# Patient Record
Sex: Male | Born: 1990
Health system: Southern US, Community
[De-identification: ages and names within clinical notes are randomized; demographics above are authoritative.]

## PROBLEM LIST (undated history)

## (undated) DIAGNOSIS — Q231 Congenital insufficiency of aortic valve: Secondary | ICD-10-CM

## (undated) DIAGNOSIS — K219 Gastro-esophageal reflux disease without esophagitis: Secondary | ICD-10-CM

## (undated) DIAGNOSIS — Q2381 Bicuspid aortic valve: Secondary | ICD-10-CM

## (undated) HISTORY — DX: Gastro-esophageal reflux disease without esophagitis: K21.9

## (undated) HISTORY — DX: Bicuspid aortic valve: Q23.81

## (undated) HISTORY — PX: HYDROCELE EXCISION: SHX482

## (undated) HISTORY — DX: Congenital insufficiency of aortic valve: Q23.1

---

## 2015-12-11 ENCOUNTER — Encounter (HOSPITAL_COMMUNITY): Payer: Self-pay | Admitting: Family Medicine

## 2015-12-11 ENCOUNTER — Ambulatory Visit (HOSPITAL_COMMUNITY)
Admission: EM | Admit: 2015-12-11 | Discharge: 2015-12-11 | Disposition: A | Discharge status: Home / Self Care | Payer: 59 | Attending: Family Medicine | Admitting: Family Medicine

## 2015-12-11 DIAGNOSIS — R112 Nausea with vomiting, unspecified: Secondary | ICD-10-CM

## 2015-12-11 DIAGNOSIS — K529 Noninfective gastroenteritis and colitis, unspecified: Secondary | ICD-10-CM

## 2015-12-11 DIAGNOSIS — R197 Diarrhea, unspecified: Secondary | ICD-10-CM

## 2015-12-11 MED ORDER — ONDANSETRON HCL 4 MG PO TABS: 4.0000 mg | ORAL_TABLET | Freq: Four times a day (QID) | ORAL | 0 refills | Status: DC

## 2015-12-11 MED ORDER — ONDANSETRON HCL 4 MG/2ML IJ SOLN
4.0000 mg | Freq: Once | INTRAMUSCULAR | Status: AC
  Administered 2015-12-11: 4 mg via INTRAMUSCULAR

## 2015-12-11 MED ORDER — ONDANSETRON 4 MG PO TBDP
ORAL_TABLET | ORAL | Status: AC
  Filled 2015-12-11: qty 1

## 2015-12-11 NOTE — Discharge Instructions (Signed)
Take the Zofran to treat her nausea and vomiting. Obtain Pedialyte to drink for fluid replacement. Drink small amounts frequently. For diarrhea you may take Imodium AD once now and if you continued to have 4-5 additional stools in the next 6 hours may take 1 more. . You do not wish to stop the diarrhea completely with this medication as this is the only way the body has to shed the virus. This should last on average approximately 3 days. Continue with the clear liquids and do not try to eat meals in the next 24 hours.

## 2015-12-11 NOTE — ED Provider Notes (Signed)
CSN: [id number]     Arrival date & time 12/11/15  1359 History   First MD Initiated Contact with Patient 12/11/15 1437     Chief Complaint  Patient presents with  . Emesis  . Diarrhea   (Consider location/radiation/quality/duration/timing/severity/associated sxs/prior Treatment) 25 year old male states he just arrived home from [place] early this morning. He seemed developed of vomiting and diarrhea, around 7 AM. He has had midabdominal pain and he feels tired. States he has vomited around 5-6 times in the last several hours and diarrhea up to 20. Denies headache, chills, fever. He does have intermittent abdominal pain particularly associated with vomiting.      History reviewed. No pertinent past medical history. History reviewed. No pertinent surgical history. History reviewed. No pertinent family history. Social History  Substance Use Topics  . Smoking status: Never Smoker  . Smokeless tobacco: Never Used  . Alcohol use Not on file    Review of Systems  Constitutional: Positive for activity change, appetite change, chills and fatigue. Negative for fever.  HENT: Negative.   Respiratory: Negative.   Gastrointestinal: Positive for abdominal pain, diarrhea and vomiting. Negative for blood in stool.  Genitourinary: Negative.   Musculoskeletal: Negative.   Skin: Negative.   Neurological: Negative.   All other systems reviewed and are negative.   Allergies  Patient has no known allergies.  Home Medications   Prior to Admission medications   Medication Sig Start Date End Date Taking? Authorizing Provider  ondansetron (ZOFRAN) 4 MG tablet Take 1 tablet (4 mg total) by mouth every 6 (six) hours. Prn N and V. 12/11/15   Hayden Rasmussenavid Jerrard Bradburn, NP   Meds Ordered and Administered this Visit   Medications  ondansetron (ZOFRAN) injection 4 mg (not administered)    BP 129/83   Pulse 105   Temp 97 F (36.1 C)   Resp 18   SpO2 95%  No data found.   Physical Exam  Constitutional: He  is oriented to person, place, and time. He appears well-developed and well-nourished. No distress.  HENT:  Head: Normocephalic and atraumatic.  Nose: Nose normal.  Mouth/Throat: Oropharynx is clear and moist. No oropharyngeal exudate.  Eyes: EOM are normal.  Neck: Normal range of motion. Neck supple.  Cardiovascular: Normal rate, regular rhythm and normal heart sounds.   Pulmonary/Chest: Effort normal and breath sounds normal.  Abdominal: Soft. Bowel sounds are normal. No hernia.  Mild generalized tenderness. No distention. No rebound.  Musculoskeletal: He exhibits no edema.  Neurological: He is alert and oriented to person, place, and time.  Skin: Skin is warm and dry. No rash noted.  Psychiatric: He has a normal mood and affect.  Nursing note and vitals reviewed.   Urgent Care Course   Clinical Course     Procedures (including critical care time)  Labs Review Labs Reviewed - No data to display  Imaging Review No results found.   Visual Acuity Review  Right Eye Distance:   Left Eye Distance:   Bilateral Distance:    Right Eye Near:   Left Eye Near:    Bilateral Near:         MDM   1. Gastroenteritis   2. Nausea vomiting and diarrhea    Take the Zofran to treat her nausea and vomiting. Obtain Pedialyte to drink for fluid replacement. Drink small amounts frequently. For diarrhea you may take Imodium AD once now and if you continued to have 4-5 additional stools in the next 6 hours may take 1 more. .Marland Kitchen  You do not wish to stop the diarrhea completely with this medication as this is the only way the body has to shed the virus. This should last on average approximately 3 days. Continue with the clear liquids and do not try to eat meals in the next 24 hours.  Meds ordered this encounter  Medications  . ondansetron (ZOFRAN) injection 4 mg  . ondansetron (ZOFRAN) 4 MG tablet    Sig: Take 1 tablet (4 mg total) by mouth every 6 (six) hours. Prn N and V.    Dispense:  12  tablet    Refill:  0    Order Specific Question:   Supervising Provider    Answer:   Micheline ChapmanHONIG, ERIN J [[id number]]       Hayden Rasmussenavid Katelin Kutsch, NP 12/11/15 1544

## 2015-12-11 NOTE — ED Triage Notes (Signed)
Pt here for N,V,D that started this am. sts recent travel on a plane.

## 2016-10-09 ENCOUNTER — Encounter: Payer: Self-pay | Admitting: Family Medicine

## 2016-10-09 ENCOUNTER — Ambulatory Visit (INDEPENDENT_AMBULATORY_CARE_PROVIDER_SITE_OTHER): Payer: 59 | Admitting: Family Medicine

## 2016-10-09 VITALS — BP 126/86 | HR 86 | Temp 98.3°F | Ht 68.5 in | Wt 205.0 lb

## 2016-10-09 DIAGNOSIS — Q231 Congenital insufficiency of aortic valve: Secondary | ICD-10-CM | POA: Diagnosis not present

## 2016-10-09 DIAGNOSIS — Q825 Congenital non-neoplastic nevus: Secondary | ICD-10-CM | POA: Diagnosis not present

## 2016-10-09 DIAGNOSIS — Z7689 Persons encountering health services in other specified circumstances: Secondary | ICD-10-CM | POA: Diagnosis not present

## 2016-10-09 LAB — CBC WITH DIFFERENTIAL/PLATELET
BASOS PCT: 0.8 % (ref 0.0–3.0)
Basophils Absolute: 0 10*3/uL (ref 0.0–0.1)
Eosinophils Absolute: 0.1 10*3/uL (ref 0.0–0.7)
Eosinophils Relative: 1.1 % (ref 0.0–5.0)
HCT: 42.8 % (ref 39.0–52.0)
Hemoglobin: 14.5 g/dL (ref 13.0–17.0)
LYMPHS PCT: 38.3 % (ref 12.0–46.0)
Lymphs Abs: 1.8 10*3/uL (ref 0.7–4.0)
MCHC: 33.8 g/dL (ref 30.0–36.0)
MCV: 89.3 fl (ref 78.0–100.0)
MONOS PCT: 10.1 % (ref 3.0–12.0)
Monocytes Absolute: 0.5 10*3/uL (ref 0.1–1.0)
NEUTROS ABS: 2.4 10*3/uL (ref 1.4–7.7)
Neutrophils Relative %: 49.7 % (ref 43.0–77.0)
PLATELETS: 229 10*3/uL (ref 150.0–400.0)
RBC: 4.8 Mil/uL (ref 4.22–5.81)
RDW: 12.4 % (ref 11.5–15.5)
WBC: 4.8 10*3/uL (ref 4.0–10.5)

## 2016-10-09 LAB — BASIC METABOLIC PANEL
BUN: 14 mg/dL (ref 6–23)
CHLORIDE: 102 meq/L (ref 96–112)
CO2: 30 meq/L (ref 19–32)
CREATININE: 1.03 mg/dL (ref 0.40–1.50)
Calcium: 9.4 mg/dL (ref 8.4–10.5)
GFR: 92.68 mL/min (ref >60.00)
GLUCOSE: 86 mg/dL (ref 70–99)
Potassium: 4 mEq/L (ref 3.5–5.1)
SODIUM: 139 meq/L (ref 135–145)

## 2016-10-09 NOTE — Patient Instructions (Signed)
We have ordered labs at this visit. It can take up to 1-2 weeks for results and processing. If results require follow up or explanation, we will call you with instructions. Clinically stable results will be released to your MYCHART or sent to you via mail. If you have not heard from us or cannot find your results in MYCHART in 2 weeks please contact our office at [phone].    

## 2016-10-09 NOTE — Progress Notes (Signed)
    Patient presents to clinic today to establish care.  SUBJECTIVE: PMH: Pt is a 26 yo male w/ pmh sig for bicuspid aortic valve. Patient states it's been a while since he's been to the doctor.  Acute concern: -Birthmark on left hip/groin -Patient states birthmark has always been there but now looks slightly different. -At times painful, now with scale, possible color change -Patient interested in getting removed.  Allergies: -NKDA, NKDA  Past surgical history: -Removal of testicular fluid  Social history: Patient is originally from [place]. Patient moved to the area to attend school [employer]. Patient remained in the area secondary to girlfriend attending law school here. Patient denies tobacco and drug use. Patient endorses occasional alcohol use.  Family medical history: Mom-AAW Dad-AAW MGM- Heart problems PGF-CVA Brother-AAW Sister-adopted  Health Maintenance: Dental -- last visit years ago [facility] --no recent visit Immunizations -- declines flu   Past Medical History:  Diagnosis Date  . Bicuspid aortic valve     History reviewed. No pertinent surgical history.  No current outpatient prescriptions on file prior to visit.   No current facility-administered medications on file prior to visit.     No Known Allergies  History reviewed. No pertinent family history.   ROS General: Denies fever, chills, night sweats, changes in weight, changes in appetite HEENT: Denies headaches, ear pain, changes in vision, rhinorrhea, sore throat CV: Denies CP, palpitations, SOB, orthopnea Pulm: Denies SOB, cough, wheezing GI: Denies abdominal pain, nausea, vomiting, diarrhea, constipation GU: Denies dysuria, hematuria, frequency, vaginal discharge Msk: Denies muscle cramps, joint pains Neuro: Denies weakness, numbness, tingling Skin: Denies rashes, bruising.  + Birthmark  Psych: Denies depression, anxiety, hallucinations  BP 126/86 (BP Location: Left  Arm, Cuff Size: Normal)   Pulse 86   Temp 98.3 F (36.8 C) (Oral)   Ht 5' 8.5" (1.74 m)   Wt 205 lb (93 kg)   BMI 30.72 kg/m   Physical Exam Gen. Pleasant, well developed, well-nourished, in NAD HEENT - [employer]/AT, PERRL, no scleral icterus, no nasal drainage, pharynx without erythema or exudate. Neck: No JVD, no thyromegaly Lungs: no accessory muscle use, CTAB, no wheezes, rales or rhonchi Cardiovascular: RRR, 2/6 murmur best heard at right upper sternal border, no peripheral edema Abdomen: BS present, soft, nontender,nondistended, no hepatosplenomegaly Musculoskeletal: No deformities, moves all four extremities, no cyanosis or clubbing, normal tone Neuro:  A&Ox3, CN II-XII intact, normal gait Skin:  Warm, dry, intact, with raised, scaly in appearance hyperpigmented lesion. Psych: normal affect, mood appropriate  No results found for this or any previous visit (from the past 2160 hour(s)).  Assessment/Plan: Birth mark  -Per patient birthmark. Similar appearance to seborrheic keratosis -Patient interested in removal - Plan: Ambulatory referral to Dermatology  Encounter to establish care -Obtain baseline labs. -Records release  Bicuspid aortic valve  -Asymptomatic -2/6 murmur heard on exam -Plan: CBC with Differential/Platelet, Basic metabolic panel, Ambulatory referral to Cardiology for ECHO

## 2016-10-10 ENCOUNTER — Encounter: Payer: Self-pay | Admitting: Family Medicine

## 2016-10-22 NOTE — Progress Notes (Signed)
Cardiology Office Note   Date:  10/24/2016   ID:  Jimmy Russo, DOB Apr 07, 1990, MRN [id number]  PCP:  Deeann Saint, MD  Cardiologist:   Rollene Rotunda, MD  Referring:  Deeann Saint, MD  Chief Complaint  Patient presents with  . Bicuspid Aortic Valve      History of Present Illness: Jimmy Russo is a 26 y.o. male who is referred by Deeann Saint, MD for evaluation of bicuspid aortic valve.  The patient says he is known about this since he was born. He was in [place]. He hasn't seen a cardiologist in several years since he was in college. He said they've been following him but told him it would not be a problem. He doesn't recall any further imaging although when prompted there might have been a CT of his aorta at some point. He did have one sibling died early as an infant but he doesn't know any details. He has 1 biological brother who has no congenital heart disease. His parents had no congenital heart disease. He has no children. He's been relatively active. He played on a paint ball club in college.  He was able to do physical activity without limitations. He doesn't exercise routinely. With his activities of daily living he denies any cardiovascular symptoms. The patient denies any new symptoms such as chest discomfort, neck or arm discomfort. There has been no new shortness of breath, PND or orthopnea. There have been no reported palpitations, presyncope or syncope.      Past Medical History:  Diagnosis Date  . Bicuspid aortic valve     Past Surgical History:  Procedure Laterality Date  . HYDROCELE EXCISION       No current outpatient prescriptions on file.   No current facility-administered medications for this visit.     Allergies:   Patient has no known allergies.    Social History:  The patient  reports that he has quit smoking. He has quit using smokeless tobacco. His smokeless tobacco use included Chew. He reports that he drinks  alcohol. He reports that he does not use drugs.   Family History:  The patient's family history includes Hyperlipidemia in his father.    ROS:  Please see the history of present illness.   Otherwise, review of systems are positive for none.   All other systems are reviewed and negative.    PHYSICAL EXAM: VS:  BP 124/90 (BP Location: Left Arm, Patient Position: Sitting, Cuff Size: Normal)   Pulse 78   Ht  (1.753 m)   Wt 207 lb 12.8 oz (94.3 kg)   BMI 30.69 kg/m  , BMI Body mass index is 30.69 kg/m. GENERAL:  Well appearing HEENT:  Pupils equal round and reactive, fundi not visualized, oral mucosa unremarkable NECK:  No jugular venous distention, waveform within normal limits, carotid upstroke brisk and symmetric, no bruits, no thyromegaly LYMPHATICS:  No cervical, inguinal adenopathy LUNGS:  Clear to auscultation bilaterally BACK:  No CVA tenderness CHEST:  Unremarkable HEART:  PMI not displaced or sustained,S1 and S2 within normal limits, no S3, no S4, positive ejection click, no rubs, very soft apical early peaking systolic murmur, no diastolic murmurs ABD:  Flat, positive bowel sounds normal in frequency in pitch, no bruits, no rebound, no guarding, no midline pulsatile mass, no hepatomegaly, no splenomegaly EXT:  2 plus pulses throughout, no edema, no cyanosis no clubbing SKIN:  No rashes no nodules NEURO:  Cranial nerves II  through XII grossly intact, motor grossly intact throughout [facility]:  Cognitively intact, oriented to person place and time    EKG:  EKG is ordered today. The ekg ordered today demonstrates sinus rhythm, rate 78, axis within normal limits, intervals within normal limits, no acute changes   Recent Labs: 10/09/2016: BUN 14; Creatinine, Ser 1.03; Hemoglobin 14.5; Platelets 229.0; Potassium 4.0; Sodium 139    Lipid Panel No results found for: CHOL, TRIG, HDL, CHOLHDL, VLDL, LDLCALC, LDLDIRECT    Wt Readings from Last 3 Encounters:  10/23/16 207 lb  12.8 oz (94.3 kg)  10/09/16 205 lb (93 kg)      Other studies Reviewed: Additional studies/ records that were reviewed today include: .None. Review of the above records demonstrates:  Please see elsewhere in the note.     ASSESSMENT AND PLAN:  BICUSPID AORTIC VALVE:  I suspect is from his exam. However, I don't suspect any significant stenosis. His blood pressures were equal in both arms. He does need imaging of his entire aorta if this has not been done before and I would suggest this but he is going to check with his parents to see what was done when he was younger. I am going to check a baseline echocardiogram. However, it's unlikely that he would need further treatment or testing at this point. Of note he does not meet current guidelines for SBE prophylaxis although I have suggested good dental hygiene. We talked at length the pathophysiology and natural history of bicuspid aortic valve.   Current medicines are reviewed at length with the patient today.  The patient does not have concerns regarding medicines.  The following changes have been made:  no change  Labs/ tests ordered today include:   Orders Placed This Encounter  Procedures  . EKG 12-Lead  . ECHOCARDIOGRAM COMPLETE     Disposition:   FU with me in one year.     Signed, Rollene Rotunda, MD  10/24/2016 7:55 AM    [facility]

## 2016-10-23 ENCOUNTER — Ambulatory Visit (INDEPENDENT_AMBULATORY_CARE_PROVIDER_SITE_OTHER): Payer: 59 | Admitting: Cardiology

## 2016-10-23 ENCOUNTER — Encounter: Payer: Self-pay | Admitting: Cardiology

## 2016-10-23 VITALS — BP 124/90 | HR 78 | Ht 69.0 in | Wt 207.8 lb

## 2016-10-23 DIAGNOSIS — Q231 Congenital insufficiency of aortic valve: Secondary | ICD-10-CM | POA: Diagnosis not present

## 2016-10-23 NOTE — Patient Instructions (Signed)
Medication Instructions:  None   If you need a refill on your cardiac medications before your next appointment, please call your pharmacy.  Labwork: None Ordered    Testing/Procedures: Your physician has requested that you have an echocardiogram. Echocardiography is a painless test that uses sound waves to create images of your heart. It provides your doctor with information about the size and shape of your heart and how well your heart's chambers and valves are working. This procedure takes approximately one hour. There are no restrictions for this procedure.   Follow-Up: Your physician wants you to follow-up in: 18 Months You should receive a reminder letter in the mail two months in advance. If you do not receive a letter, please call our office [phone].   Thank you for choosing [facility] at [facility]!!

## 2016-10-24 ENCOUNTER — Encounter: Payer: Self-pay | Admitting: Cardiology

## 2016-10-31 ENCOUNTER — Ambulatory Visit (HOSPITAL_COMMUNITY): Payer: 59 | Attending: Internal Medicine

## 2016-10-31 ENCOUNTER — Other Ambulatory Visit: Payer: Self-pay

## 2016-10-31 DIAGNOSIS — Q231 Congenital insufficiency of aortic valve: Secondary | ICD-10-CM | POA: Insufficient documentation

## 2016-10-31 DIAGNOSIS — Z87891 Personal history of nicotine dependence: Secondary | ICD-10-CM | POA: Diagnosis not present

## 2016-10-31 DIAGNOSIS — Z8249 Family history of ischemic heart disease and other diseases of the circulatory system: Secondary | ICD-10-CM | POA: Diagnosis not present

## 2016-11-04 ENCOUNTER — Telehealth: Payer: Self-pay | Admitting: Cardiology

## 2016-11-04 NOTE — Telephone Encounter (Signed)
Returning your call from yesterday,concerning his Echo results.

## 2016-11-05 NOTE — Telephone Encounter (Signed)
Reviewed results with patient. He is unsure if he has had MRI or CT scan but has had previus Echo. He will try to find out what he has had and have sent to office along with previous Echo.   Notes recorded by Barrie Dunkerhomas, Nyasha N on 11/03/2016 at 2:55 PM EDT Leave message for pt to call back  ------  Notes recorded by Rollene RotundaHochrein, James, MD on 11/02/2016 at 9:10 AM EDT Bicuspid valve. Mild AS.  No AI.  No change in therapy. Please call him and ask him if he found out anything about his cardiology studies that he has had. In particular, I am looking to see if he has ever had a CT of his aorta or MRI of his aorta. Call Mr. Will Bonnetezzullo with the results and send results to Deeann SaintBanks, Shannon R, MD

## 2016-11-05 NOTE — Telephone Encounter (Signed)
Follow up ° ° ° ° °Patient calling for results. Please call °

## 2016-11-14 ENCOUNTER — Ambulatory Visit: Payer: 59 | Admitting: Cardiology

## 2016-12-04 ENCOUNTER — Telehealth: Payer: Self-pay | Admitting: Cardiology

## 2016-12-04 NOTE — Telephone Encounter (Signed)
I have called and left a couple of messages to discuss further testing with this patient.  I will continue to try to contact him.

## 2017-01-22 ENCOUNTER — Telehealth: Payer: Self-pay | Admitting: *Deleted

## 2017-01-22 NOTE — Telephone Encounter (Signed)
-----   Message from James Hochrein, MD sent at 01/14/2017  5:48 PM EST ----- Can you see if there is anyway to get in touch with this patient.  I have left messages but I am not sure if we have the correct number.  

## 2017-01-22 NOTE — Telephone Encounter (Signed)
Called and left several messages on pt voice mail to give out office a call

## 2017-01-22 NOTE — Telephone Encounter (Signed)
-----   Message from Rollene RotundaJames Hochrein, MD sent at 01/14/2017  5:48 PM EST ----- Can you see if there is anyway to get in touch with this patient.  I have left messages but I am not sure if we have the correct number.

## 2017-01-22 NOTE — Telephone Encounter (Signed)
Letter to contact office mail to pt.

## 2017-01-23 ENCOUNTER — Telehealth: Payer: Self-pay | Admitting: Cardiology

## 2017-01-23 NOTE — Telephone Encounter (Signed)
F/U call: ° °Patient returning call  °

## 2017-01-23 NOTE — Telephone Encounter (Signed)
Returned the call to the patient. He stated that he was returning a phone call. According to previous notes, it looks as if his provider has been trying to get in touch with him. His number is correct in epic and he has stated that this is a good number for him to be reached at. Message will be routed to the provider for his knowledge.

## 2017-01-29 NOTE — Telephone Encounter (Signed)
Follow up   Patient returning phone call 

## 2017-03-24 ENCOUNTER — Encounter: Payer: Self-pay | Admitting: Cardiology

## 2017-03-24 DIAGNOSIS — Q2381 Bicuspid aortic valve: Secondary | ICD-10-CM

## 2017-03-24 DIAGNOSIS — I350 Nonrheumatic aortic (valve) stenosis: Secondary | ICD-10-CM

## 2017-03-24 DIAGNOSIS — Q231 Congenital insufficiency of aortic valve: Secondary | ICD-10-CM

## 2017-03-24 DIAGNOSIS — Z8249 Family history of ischemic heart disease and other diseases of the circulatory system: Secondary | ICD-10-CM

## 2017-03-27 ENCOUNTER — Encounter: Payer: Self-pay | Admitting: Family Medicine

## 2017-03-27 ENCOUNTER — Ambulatory Visit: Payer: 59 | Admitting: Family Medicine

## 2017-03-27 VITALS — BP 132/88 | HR 86 | Temp 98.6°F | Wt 205.0 lb

## 2017-03-27 DIAGNOSIS — K219 Gastro-esophageal reflux disease without esophagitis: Secondary | ICD-10-CM | POA: Diagnosis not present

## 2017-03-27 DIAGNOSIS — R1011 Right upper quadrant pain: Secondary | ICD-10-CM | POA: Diagnosis not present

## 2017-03-27 LAB — CBC
HEMATOCRIT: 42.8 % (ref 39.0–52.0)
Hemoglobin: 14.6 g/dL (ref 13.0–17.0)
MCHC: 34.1 g/dL (ref 30.0–36.0)
MCV: 88.6 fl (ref 78.0–100.0)
PLATELETS: 243 10*3/uL (ref 150.0–400.0)
RBC: 4.83 Mil/uL (ref 4.22–5.81)
RDW: 12.6 % (ref 11.5–15.5)
WBC: 5.7 10*3/uL (ref 4.0–10.5)

## 2017-03-27 LAB — COMPREHENSIVE METABOLIC PANEL
ALT: 13 U/L (ref 0–53)
AST: 13 U/L (ref 0–37)
Albumin: 4.6 g/dL (ref 3.5–5.2)
Alkaline Phosphatase: 56 U/L (ref 39–117)
BUN: 14 mg/dL (ref 6–23)
CALCIUM: 9.6 mg/dL (ref 8.4–10.5)
CHLORIDE: 101 meq/L (ref 96–112)
CO2: 30 meq/L (ref 19–32)
CREATININE: 0.87 mg/dL (ref 0.40–1.50)
GFR: 112.22 mL/min (ref >60.00)
Glucose, Bld: 88 mg/dL (ref 70–99)
POTASSIUM: 4.1 meq/L (ref 3.5–5.1)
SODIUM: 138 meq/L (ref 135–145)
Total Bilirubin: 0.7 mg/dL (ref 0.2–1.2)
Total Protein: 7.4 g/dL (ref 6.0–8.3)

## 2017-03-27 LAB — LIPASE: LIPASE: 28 U/L (ref 11.0–59.0)

## 2017-03-27 MED ORDER — OMEPRAZOLE 20 MG PO CPDR: 20.0000 mg | DELAYED_RELEASE_CAPSULE | Freq: Every day | ORAL | 3 refills | Status: DC

## 2017-03-27 NOTE — Progress Notes (Signed)
Subjective:    Patient ID: Jimmy Russo, male    DOB: 10/09/1990, 27 y.o.   MRN: [id number]  No chief complaint on file.   HPI Patient was seen today for ongoing concern.  Patient endorses right upper quadrant pain noticed after eating.  This is been happening off and on times 8 months.  Patient notes pain/symptoms are worse with eating fast food and drinking beer.  He is also endorses symptoms at night including heartburn, increased flatus, some nausea, acid taste in mouth.  Patient may take Tums occasionally.  Patient endorses daily BMs but notes they are small pimple-like in size.  Patient states in the [facility], x-rays were done and he was told there was some gas in his colon.  Past Medical History:  Diagnosis Date  . Bicuspid aortic valve     No Known Allergies  ROS General: Denies fever, chills, night sweats, changes in weight, changes in appetite HEENT: Denies headaches, ear pain, changes in vision, rhinorrhea, sore throat  + acid taste in mouth CV: Denies CP, palpitat, flatus ions, SOB, orthopnea Pulm: Denies SOB, cough, wheezing GI: Denies vomiting, diarrhea, constipation  + RUQ abdominal pain, nausea GU: Denies dysuria, hematuria, frequency, vaginal discharge Msk: Denies muscle cramps, joint pains Neuro: Denies weakness, numbness, tingling Skin: Denies rashes, bruising Psych: Denies depression, anxiety, hallucinations     Objective:    Blood pressure 132/88, pulse 86, temperature 98.6 F (37 C), temperature source Oral, weight 205 lb (93 kg), SpO2 99 %.   Gen. Pleasant, well-nourished, in no distress, normal affect   HEENT: [place]/AT, face symmetric, no scleral icterus, PERRLA, nares patent without drainage, pharynx without erythema or exudate. Neck: No JVD, no thyromegaly, no carotid bruits Lungs: no accessory muscle use, CTAB, no wheezes or rales Cardiovascular: RRR, 2/6 murmur best heard l sternal border, no peripheral  edema Abdomen: BS present, soft, mild TTP RUQ/ND, no hepatosplenomegaly. Neuro:  A&Ox3, CN II-XII intact, normal gait   Wt Readings from Last 3 Encounters:  03/27/17 205 lb (93 kg)  10/23/16 207 lb 12.8 oz (94.3 kg)  10/09/16 205 lb (93 kg)    Lab Results  Component Value Date   WBC 4.8 10/09/2016   HGB 14.5 10/09/2016   HCT 42.8 10/09/2016   PLT 229.0 10/09/2016   GLUCOSE 86 10/09/2016   NA 139 10/09/2016   K 4.0 10/09/2016   CL 102 10/09/2016   CREATININE 1.03 10/09/2016   BUN 14 10/09/2016   CO2 30 10/09/2016    Assessment/Plan:  RUQ pain  -Discussed signs and symptoms of biliary colic. -We will obtain labs and right upper quadrant ultrasound. -Given handout - Plan: CBC (no diff), Comprehensive metabolic panel, Lipase, US Abdomen Limited RUQ -Given RTC or ED precautions  Gastroesophageal reflux disease, esophagitis presence not specified  - Plan: omeprazole (PRILOSEC) 20 MG capsule -Given a list of foods that are known to cause current symptoms.  Follow-up PRN the next few weeks  Abbe AmsterdamShannon Latera Mclin, MD

## 2017-03-27 NOTE — Patient Instructions (Addendum)
Biliary Colic, Adult Biliary colic is severe pain caused by a problem with a small organ in the upper right part of your belly (gallbladder). The gallbladder stores a digestive fluid produced in the liver (bile) that helps the body break down fat. Bile and other digestive enzymes are carried from the liver to the small intestine though tube-like structures (bile ducts). The gallbladder and the bile ducts form the biliary tract. Sometimes hard deposits of digestive fluids form in the gallbladder (gallstones) and block the flow of bile from the gallbladder, causing biliary colic. This condition is also called a gallbladder attack. Gallstones can be as small as a grain of sand or as big as a golf ball. There could be just one gallstone in the gallbladder, or there could be many. What are the causes? Biliary colic is usually caused by gallstones. Less often, a tumor could block the flow of bile from the gallbladder and trigger biliary colic. What increases the risk? This condition is more likely to develop in:  Women.  People of Hispanic descent.  People with a family history of gallstones.  People who are obese.  People who suddenly or quickly lose weight.  People who eat a high-calorie, low-fiber diet that is rich in refined carbs (carbohydrates), such as white bread and white rice.  People who have an intestinal disease that affects nutrient absorption, such as Crohn disease.  People who have a metabolic condition, such as metabolic syndrome or diabetes.  What are the signs or symptoms? Severe pain in the upper right side of the belly is the main symptom of biliary colic. You may feel this pain below the chest but above the hip. This pain often occurs at night or after eating a very fatty meal. This pain may get worse for up to an hour and last as long as 12 hours. In most cases, the pain fades (subsides) within a couple hours. Other symptoms of this condition include:  Nausea and  vomiting.  Pain under the right shoulder.  How is this diagnosed? This condition is diagnosed based on your medical history, your symptoms, and a physical exam. You may have tests, including:  Blood tests to rule out infection or inflammation of the bile ducts, gallbladder, pancreas, or liver.  Imaging studies such as: ? Ultrasound. ? CT scan. ? MRI.  In some cases, you may need to have an imaging study done using a small amount of radioactive material (nuclear medicine) to confirm the diagnosis. How is this treated? Treatment for this condition may include medicine to relieve your pain or nausea. If you have gallstones that are causing biliary colic, you may need surgery to remove the gallbladder (cholecystectomy). Gallstones can also be dissolved gradually with medicine. It may take months or years before the gallstones are completely gone. Follow these instructions at home:  Take over-the-counter and prescription medicines only as told by your health care provider.  Drink enough fluid to keep your urine clear or pale yellow.  Follow instructions from your health care provider about eating or drinking restrictions. These may include avoiding: ? Fatty, greasy, and fried foods. ? Any foods that make the pain worse. ? Overeating. ? Having a large meal after not eating for a while.  Keep all follow-up visits as told by your health care provider. This is important. How is this prevented? Steps to prevent this condition include:  Maintaining a healthy body weight.  Getting regular exercise.  Eating a healthy, high-fiber, low-fat diet.  Limiting  how much sugar and refined carbs you eat, such as sweets, white flour, and white rice.  Contact a health care provider if:  Your pain lasts more than 5 hours.  You vomit.  You have a fever and chills.  Your pain gets worse. Get help right away if:  Your skin or the whites of your eyes look yellow (jaundice).  Your have  tea-colored urine and light-colored stools.  You are dizzy or you faint. This information is not intended to replace advice given to you by your health care provider. Make sure you discuss any questions you have with your health care provider. Document Released: 06/09/2005 Document Revised: 09/04/2015 Document Reviewed: 07/23/2015 Elsevier Interactive Patient Education  2018 [employer]  Food Choices for Gastroesophageal Reflux Disease, Adult When you have gastroesophageal reflux disease (GERD), the foods you eat and your eating habits are very important. Choosing the right foods can help ease the discomfort of GERD. Consider working with a diet and nutrition specialist (dietitian) to help you make healthy food choices. What general guidelines should I follow? Eating plan  Choose healthy foods low in fat, such as fruits, vegetables, whole grains, low-fat dairy products, and lean meat, fish, and poultry.  Eat frequent, small meals instead of three large meals each day. Eat your meals slowly, in a relaxed setting. Avoid bending over or lying down until 2-3 hours after eating.  Limit high-fat foods such as fatty meats or fried foods.  Limit your intake of oils, butter, and shortening to less than 8 teaspoons each day.  Avoid the following: ? Foods that cause symptoms. These may be different for different people. Keep a food diary to keep track of foods that cause symptoms. ? Alcohol. ? Drinking large amounts of liquid with meals. ? Eating meals during the 2-3 hours before bed.  Cook foods using methods other than frying. This may include baking, grilling, or broiling. Lifestyle   Maintain a healthy weight. Ask your health care provider what weight is healthy for you. If you need to lose weight, work with your health care provider to do so safely.  Exercise for at least 30 minutes on 5 or more days each week, or as told by your health care provider.  Avoid wearing clothes that fit  tightly around your waist and chest.  Do not use any products that contain nicotine or tobacco, such as cigarettes and e-cigarettes. If you need help quitting, ask your health care provider.  Sleep with the head of your bed raised. Use a wedge under the mattress or blocks under the bed frame to raise the head of the bed. What foods are not recommended? The items listed may not be a complete list. Talk with your dietitian about what dietary choices are best for you. Grains Pastries or quick breads with added fat. [place] toast. Vegetables Deep fried vegetables. [place] fries. Any vegetables prepared with added fat. Any vegetables that cause symptoms. For some people this may include tomatoes and tomato products, chili peppers, onions and garlic, and horseradish. Fruits Any fruits prepared with added fat. Any fruits that cause symptoms. For some people this may include citrus fruits, such as oranges, grapefruit, pineapple, and lemons. Meats and other protein foods High-fat meats, such as fatty beef or pork, hot dogs, ribs, ham, sausage, salami and bacon. Fried meat or protein, including fried fish and fried chicken. Nuts and nut butters. Dairy Whole milk and chocolate milk. Sour cream. Cream. Ice cream. Cream cheese. Milk shakes. Beverages Coffee  and tea, with or without caffeine. Carbonated beverages. Sodas. Energy drinks. Fruit juice made with acidic fruits (such as orange or grapefruit). Tomato juice. Alcoholic drinks. Fats and oils Butter. Margarine. Shortening. Ghee. Sweets and desserts Chocolate and cocoa. Donuts. Seasoning and other foods Pepper. Peppermint and spearmint. Any condiments, herbs, or seasonings that cause symptoms. For some people, this may include curry, hot sauce, or vinegar-based salad dressings. Summary  When you have gastroesophageal reflux disease (GERD), food and lifestyle choices are very important to help ease the discomfort of GERD.  Eat frequent, small meals  instead of three large meals each day. Eat your meals slowly, in a relaxed setting. Avoid bending over or lying down until 2-3 hours after eating.  Limit high-fat foods such as fatty meat or fried foods. This information is not intended to replace advice given to you by your health care provider. Make sure you discuss any questions you have with your health care provider. Document Released: 01/06/2005 Document Revised: 01/08/2016 Document Reviewed: 01/08/2016 Elsevier Interactive Patient Education  2018 [employer]  Gastroesophageal Reflux Disease, Adult Normally, food travels down the esophagus and stays in the stomach to be digested. If a person has gastroesophageal reflux disease (GERD), food and stomach acid move back up into the esophagus. When this happens, the esophagus becomes sore and swollen (inflamed). Over time, GERD can make small holes (ulcers) in the lining of the esophagus. Follow these instructions at home: Diet  Follow a diet as told by your doctor. You may need to avoid foods and drinks such as: ? Coffee and tea (with or without caffeine). ? Drinks that contain alcohol. ? Energy drinks and sports drinks. ? Carbonated drinks or sodas. ? Chocolate and cocoa. ? Peppermint and mint flavorings. ? Garlic and onions. ? Horseradish. ? Spicy and acidic foods, such as peppers, chili powder, curry powder, vinegar, hot sauces, and BBQ sauce. ? Citrus fruit juices and citrus fruits, such as oranges, lemons, and limes. ? Tomato-based foods, such as red sauce, chili, salsa, and pizza with red sauce. ? Fried and fatty foods, such as donuts, french fries, potato chips, and high-fat dressings. ? High-fat meats, such as hot dogs, rib eye steak, sausage, ham, and bacon. ? High-fat dairy items, such as whole milk, butter, and cream cheese.  Eat small meals often. Avoid eating large meals.  Avoid drinking large amounts of liquid with your meals.  Avoid eating meals during the 2-3  hours before bedtime.  Avoid lying down right after you eat.  Do not exercise right after you eat. General instructions  Pay attention to any changes in your symptoms.  Take over-the-counter and prescription medicines only as told by your doctor. Do not take aspirin, ibuprofen, or other NSAIDs unless your doctor says it is okay.  Do not use any tobacco products, including cigarettes, chewing tobacco, and e-cigarettes. If you need help quitting, ask your doctor.  Wear loose clothes. Do not wear anything tight around your waist.  Raise (elevate) the head of your bed about 6 inches (15 cm).  Try to lower your stress. If you need help doing this, ask your doctor.  If you are overweight, lose an amount of weight that is healthy for you. Ask your doctor about a safe weight loss goal.  Keep all follow-up visits as told by your doctor. This is important. Contact a doctor if:  You have new symptoms.  You lose weight and you do not know why it is happening.  You have trouble  swallowing, or it hurts to swallow.  You have wheezing or a cough that keeps happening.  Your symptoms do not get better with treatment.  You have a hoarse voice. Get help right away if:  You have pain in your arms, neck, jaw, teeth, or back.  You feel sweaty, dizzy, or light-headed.  You have chest pain or shortness of breath.  You throw up (vomit) and your throw up looks like blood or coffee grounds.  You pass out (faint).  Your poop (stool) is bloody or black.  You cannot swallow, drink, or eat. This information is not intended to replace advice given to you by your health care provider. Make sure you discuss any questions you have with your health care provider. Document Released: 06/25/2007 Document Revised: 06/14/2015 Document Reviewed: 05/03/2014 Elsevier Interactive Patient Education  [employer].

## 2017-04-01 ENCOUNTER — Telehealth: Payer: Self-pay | Admitting: *Deleted

## 2017-04-01 NOTE — Telephone Encounter (Signed)
-----   Message from Rollene RotundaJames Hochrein, MD sent at 03/31/2017  6:47 PM EDT ----- Both ----- Message ----- From: Barrie Dunkerhomas, Nyasha N Sent: 03/31/2017   4:59 PM To: Rollene RotundaJames Hochrein, MD  Just to clarify do you want the coronary CT the one Dr Delton SeeNelson and Dr Eden EmmsNishan read or do you want the CT of his Aorta.Marland Kitchen.  ----- Message ----- From: Rollene RotundaHochrein, James, MD Sent: 03/25/2017  10:15 PM To: Barrie DunkerNyasha N Thomas  This patient needs a CT of the coronaries and aorta.   Not sure how this is ordered.  Please let me know when it is scheduled and call the patient.  Thanks.

## 2017-04-01 NOTE — Telephone Encounter (Signed)
CTA ordered and send to scheduler to be schedule 

## 2017-04-01 NOTE — Telephone Encounter (Signed)
-----   Message from James Hochrein, MD sent at 03/31/2017  6:47 PM EDT ----- Both ----- Message ----- From: Bartt Gonzaga N Sent: 03/31/2017   4:59 PM To: James Hochrein, MD  Just to clarify do you want the coronary CT the one Dr Nelson and Dr Nishan read or do you want the CT of his Aorta..  ----- Message ----- From: Hochrein, James, MD Sent: 03/25/2017  10:15 PM To: Kymere Fullington N Presli Fanguy  This patient needs a CT of the coronaries and aorta.   Not sure how this is ordered.  Please let me know when it is scheduled and call the patient.  Thanks.    

## 2017-04-02 ENCOUNTER — Telehealth: Payer: Self-pay | Admitting: *Deleted

## 2017-04-02 DIAGNOSIS — I350 Nonrheumatic aortic (valve) stenosis: Secondary | ICD-10-CM

## 2017-04-02 DIAGNOSIS — I359 Nonrheumatic aortic valve disorder, unspecified: Secondary | ICD-10-CM

## 2017-04-02 NOTE — Telephone Encounter (Signed)
-----   Message from James Hochrein, MD sent at 03/31/2017  6:47 PM EDT ----- Both ----- Message ----- From: Decorey Wahlert N Sent: 03/31/2017   4:59 PM To: James Hochrein, MD  Just to clarify do you want the coronary CT the one Dr Nelson and Dr Nishan read or do you want the CT of his Aorta..  ----- Message ----- From: Hochrein, James, MD Sent: 03/25/2017  10:15 PM To: Iretta Mangrum N Biddie Sebek  This patient needs a CT of the coronaries and aorta.   Not sure how this is ordered.  Please let me know when it is scheduled and call the patient.  Thanks.    

## 2017-04-03 ENCOUNTER — Ambulatory Visit
Admission: RE | Admit: 2017-04-03 | Discharge: 2017-04-03 | Disposition: A | Discharge status: Home / Self Care | Payer: 59 | Source: Ambulatory Visit | Attending: Family Medicine | Admitting: Family Medicine

## 2017-04-03 DIAGNOSIS — R1011 Right upper quadrant pain: Secondary | ICD-10-CM

## 2017-04-16 ENCOUNTER — Ambulatory Visit (HOSPITAL_COMMUNITY)
Admission: RE | Admit: 2017-04-16 | Discharge: 2017-04-16 | Disposition: A | Discharge status: Home / Self Care | Payer: 59 | Source: Ambulatory Visit | Attending: Cardiology | Admitting: Cardiology

## 2017-04-16 ENCOUNTER — Ambulatory Visit (HOSPITAL_COMMUNITY): Admission: RE | Admit: 2017-04-16 | Payer: 59 | Source: Ambulatory Visit

## 2017-04-16 ENCOUNTER — Encounter (HOSPITAL_COMMUNITY): Payer: Self-pay

## 2017-04-16 ENCOUNTER — Ambulatory Visit (HOSPITAL_COMMUNITY): Payer: 59

## 2017-04-16 DIAGNOSIS — Z8249 Family history of ischemic heart disease and other diseases of the circulatory system: Secondary | ICD-10-CM

## 2017-04-16 DIAGNOSIS — I350 Nonrheumatic aortic (valve) stenosis: Secondary | ICD-10-CM

## 2017-04-16 DIAGNOSIS — I359 Nonrheumatic aortic valve disorder, unspecified: Secondary | ICD-10-CM

## 2017-04-16 DIAGNOSIS — Q231 Congenital insufficiency of aortic valve: Secondary | ICD-10-CM | POA: Insufficient documentation

## 2017-04-16 DIAGNOSIS — R079 Chest pain, unspecified: Secondary | ICD-10-CM | POA: Diagnosis not present

## 2017-04-16 MED ORDER — IOPAMIDOL (ISOVUE-370) INJECTION 76%
INTRAVENOUS | Status: AC
  Administered 2017-04-16: 100 mL
  Filled 2017-04-16: qty 100

## 2017-04-16 MED ORDER — METOPROLOL TARTRATE 5 MG/5ML IV SOLN
INTRAVENOUS | Status: AC
  Administered 2017-04-16: 5 mg via INTRAVENOUS
  Filled 2017-04-16: qty 20

## 2017-04-16 MED ORDER — METOPROLOL TARTRATE 5 MG/5ML IV SOLN
5.0000 mg | INTRAVENOUS | Status: DC | PRN
Start: 2017-04-16 — End: 2017-04-17
  Administered 2017-04-16 (×3): 5 mg via INTRAVENOUS

## 2017-04-16 MED ORDER — NITROGLYCERIN 0.4 MG SL SUBL
SUBLINGUAL_TABLET | SUBLINGUAL | Status: AC
  Administered 2017-04-16: 0.8 mg
  Filled 2017-04-16: qty 2

## 2017-05-20 ENCOUNTER — Encounter: Payer: Self-pay | Admitting: Family Medicine

## 2017-05-20 ENCOUNTER — Ambulatory Visit: Payer: 59 | Admitting: Family Medicine

## 2017-05-20 VITALS — BP 130/80 | HR 99 | Temp 98.2°F | Ht 68.0 in | Wt 206.0 lb

## 2017-05-20 DIAGNOSIS — K219 Gastro-esophageal reflux disease without esophagitis: Secondary | ICD-10-CM | POA: Diagnosis not present

## 2017-05-20 NOTE — Progress Notes (Signed)
Subjective:    Patient ID: Jimmy Russo, male    DOB: December 24, 1990, 27 y.o.   MRN: [id number]  Chief Complaint  Patient presents with  . Gastroesophageal Reflux    HPI Patient was seen today for f/u on reflux.  Pt states he has noticed an improvement in his reflux symptoms since starting omeprazole 20 mg daily.  Pt does note he had an episode last night of acid burning the back of his throat/acid taste in his mouth after eating pierogies then laying down shortly after.  Past Medical History:  Diagnosis Date  . Bicuspid aortic valve     No Known Allergies  ROS General: Denies fever, chills, night sweats, changes in weight, changes in appetite HEENT: Denies headaches, ear pain, changes in vision, rhinorrhea, sore throat CV: Denies CP, palpitations, SOB, orthopnea Pulm: Denies SOB, cough, wheezing GI: Denies abdominal pain, nausea, vomiting, diarrhea, constipation   +acid reflux GU: Denies dysuria, hematuria, frequency, vaginal discharge Msk: Denies muscle cramps, joint pains Neuro: Denies weakness, numbness, tingling Skin: Denies rashes, bruising Psych: Denies depression, anxiety, hallucinations     Objective:    Blood pressure 130/80, pulse 99, temperature 98.2 F (36.8 C), temperature source Oral, height  (1.727 m), weight 206 lb (93.4 kg), SpO2 97 %.   Gen. Pleasant, well-nourished, in no distress, normal affect   HEENT: [place]/AT, face symmetric,  no scleral icterus, PERRLA,  nares patent without drainage, pharynx with mild erythema, no exudate.  No cervical lymphadenopathy. Lungs: no accessory muscle use, CTAB, no wheezes or rales Cardiovascular: RRR, no peripheral edema Abdomen: BS present, soft, NT/ND Neuro:  A&Ox3, CN II-XII intact, normal gait   Wt Readings from Last 3 Encounters:  05/20/17 206 lb (93.4 kg)  03/27/17 205 lb (93 kg)  10/23/16 207 lb 12.8 oz (94.3 kg)    Lab Results  Component Value Date   WBC 5.7 03/27/2017   HGB 14.6 03/27/2017   HCT 42.8 03/27/2017   PLT 243.0 03/27/2017   GLUCOSE 88 03/27/2017   ALT 13 03/27/2017   AST 13 03/27/2017   NA 138 03/27/2017   K 4.1 03/27/2017   CL 101 03/27/2017   CREATININE 0.87 03/27/2017   BUN 14 03/27/2017   CO2 30 03/27/2017    Assessment/Plan:  Gastroesophageal reflux disease, esophagitis presence not specified -continue omeprazole 20 mg daily -consider trying maalox for current throat irritation. -avoid foods known to causes symptoms -pt given handout on food choices for people with gerd. -f/u prn in the next few months  Abbe Amsterdam, MD

## 2017-05-20 NOTE — Patient Instructions (Addendum)
You can try Maalox for the irritation in your throat from your acid reflux.  It can be found over the counter at your local drug store or [employer] type store.  Gastroesophageal Reflux Disease, Adult Normally, food travels down the esophagus and stays in the stomach to be digested. However, when a person has gastroesophageal reflux disease (GERD), food and stomach acid move back up into the esophagus. When this happens, the esophagus becomes sore and inflamed. Over time, GERD can create small holes (ulcers) in the lining of the esophagus. What are the causes? This condition is caused by a problem with the muscle between the esophagus and the stomach (lower esophageal sphincter, or LES). Normally, the LES muscle closes after food passes through the esophagus to the stomach. When the LES is weakened or abnormal, it does not close properly, and that allows food and stomach acid to go back up into the esophagus. The LES can be weakened by certain dietary substances, medicines, and medical conditions, including:  Tobacco use.  Pregnancy.  Having a hiatal hernia.  Heavy alcohol use.  Certain foods and beverages, such as coffee, chocolate, onions, and peppermint.  What increases the risk? This condition is more likely to develop in:  People who have an increased body weight.  People who have connective tissue disorders.  People who use NSAID medicines.  What are the signs or symptoms? Symptoms of this condition include:  Heartburn.  Difficult or painful swallowing.  The feeling of having a lump in the throat.  Abitter taste in the mouth.  Bad breath.  Having a large amount of saliva.  Having an upset or bloated stomach.  Belching.  Chest pain.  Shortness of breath or wheezing.  Ongoing (chronic) cough or a night-time cough.  Wearing away of tooth enamel.  Weight loss.  Different conditions can cause chest pain. Make sure to see your health care provider if you  experience chest pain. How is this diagnosed? Your health care provider will take a medical history and perform a physical exam. To determine if you have mild or severe GERD, your health care provider may also monitor how you respond to treatment. You may also have other tests, including:  An endoscopy toexamine your stomach and esophagus with a small camera.  A test thatmeasures the acidity level in your esophagus.  A test thatmeasures how much pressure is on your esophagus.  A barium swallow or modified barium swallow to show the shape, size, and functioning of your esophagus.  How is this treated? The goal of treatment is to help relieve your symptoms and to prevent complications. Treatment for this condition may vary depending on how severe your symptoms are. Your health care provider may recommend:  Changes to your diet.  Medicine.  Surgery.  Follow these instructions at home: Diet  Follow a diet as recommended by your health care provider. This may involve avoiding foods and drinks such as: ? Coffee and tea (with or without caffeine). ? Drinks that containalcohol. ? Energy drinks and sports drinks. ? Carbonated drinks or sodas. ? Chocolate and cocoa. ? Peppermint and mint flavorings. ? Garlic and onions. ? Horseradish. ? Spicy and acidic foods, including peppers, chili powder, curry powder, vinegar, hot sauces, and barbecue sauce. ? Citrus fruit juices and citrus fruits, such as oranges, lemons, and limes. ? Tomato-based foods, such as red sauce, chili, salsa, and pizza with red sauce. ? Fried and fatty foods, such as donuts, french fries, potato chips, and high-fat dressings. ?  High-fat meats, such as hot dogs and fatty cuts of red and white meats, such as rib eye steak, sausage, ham, and bacon. ? High-fat dairy items, such as whole milk, butter, and cream cheese.  Eat small, frequent meals instead of large meals.  Avoid drinking large amounts of liquid with your  meals.  Avoid eating meals during the 2-3 hours before bedtime.  Avoid lying down right after you eat.  Do not exercise right after you eat. General instructions  Pay attention to any changes in your symptoms.  Take over-the-counter and prescription medicines only as told by your health care provider. Do not take aspirin, ibuprofen, or other NSAIDs unless your health care provider told you to do so.  Do not use any tobacco products, including cigarettes, chewing tobacco, and e-cigarettes. If you need help quitting, ask your health care provider.  Wear loose-fitting clothing. Do not wear anything tight around your waist that causes pressure on your abdomen.  Raise (elevate) the head of your bed 6 inches (15cm).  Try to reduce your stress, such as with yoga or meditation. If you need help reducing stress, ask your health care provider.  If you are overweight, reduce your weight to an amount that is healthy for you. Ask your health care provider for guidance about a safe weight loss goal.  Keep all follow-up visits as told by your health care provider. This is important. Contact a health care provider if:  You have new symptoms.  You have unexplained weight loss.  You have difficulty swallowing, or it hurts to swallow.  You have wheezing or a persistent cough.  Your symptoms do not improve with treatment.  You have a hoarse voice. Get help right away if:  You have pain in your arms, neck, jaw, teeth, or back.  You feel sweaty, dizzy, or light-headed.  You have chest pain or shortness of breath.  You vomit and your vomit looks like blood or coffee grounds.  You faint.  Your stool is bloody or black.  You cannot swallow, drink, or eat. This information is not intended to replace advice given to you by your health care provider. Make sure you discuss any questions you have with your health care provider. Document Released: 10/16/2004 Document Revised: 06/06/2015  Document Reviewed: 05/03/2014 Elsevier Interactive Patient Education  2018 [employer] for Gastroesophageal Reflux Disease, Adult When you have gastroesophageal reflux disease (GERD), the foods you eat and your eating habits are very important. Choosing the right foods can help ease the discomfort of GERD. Consider working with a diet and nutrition specialist (dietitian) to help you make healthy food choices. What general guidelines should I follow? Eating plan  Choose healthy foods low in fat, such as fruits, vegetables, whole grains, low-fat dairy products, and lean meat, fish, and poultry.  Eat frequent, small meals instead of three large meals each day. Eat your meals slowly, in a relaxed setting. Avoid bending over or lying down until 2-3 hours after eating.  Limit high-fat foods such as fatty meats or fried foods.  Limit your intake of oils, butter, and shortening to less than 8 teaspoons each day.  Avoid the following: ? Foods that cause symptoms. These may be different for different people. Keep a food diary to keep track of foods that cause symptoms. ? Alcohol. ? Drinking large amounts of liquid with meals. ? Eating meals during the 2-3 hours before bed.  Cook foods using methods other than frying. This may include  baking, grilling, or broiling. Lifestyle   Maintain a healthy weight. Ask your health care provider what weight is healthy for you. If you need to lose weight, work with your health care provider to do so safely.  Exercise for at least 30 minutes on 5 or more days each week, or as told by your health care provider.  Avoid wearing clothes that fit tightly around your waist and chest.  Do not use any products that contain nicotine or tobacco, such as cigarettes and e-cigarettes. If you need help quitting, ask your health care provider.  Sleep with the head of your bed raised. Use a wedge under the mattress or blocks under the bed frame to raise the  head of the bed. What foods are not recommended? The items listed may not be a complete list. Talk with your dietitian about what dietary choices are best for you. Grains Pastries or quick breads with added fat. [place] toast. Vegetables Deep fried vegetables. [place] fries. Any vegetables prepared with added fat. Any vegetables that cause symptoms. For some people this may include tomatoes and tomato products, chili peppers, onions and garlic, and horseradish. Fruits Any fruits prepared with added fat. Any fruits that cause symptoms. For some people this may include citrus fruits, such as oranges, grapefruit, pineapple, and lemons. Meats and other protein foods High-fat meats, such as fatty beef or pork, hot dogs, ribs, ham, sausage, salami and bacon. Fried meat or protein, including fried fish and fried chicken. Nuts and nut butters. Dairy Whole milk and chocolate milk. Sour cream. Cream. Ice cream. Cream cheese. Milk shakes. Beverages Coffee and tea, with or without caffeine. Carbonated beverages. Sodas. Energy drinks. Fruit juice made with acidic fruits (such as orange or grapefruit). Tomato juice. Alcoholic drinks. Fats and oils Butter. Margarine. Shortening. Ghee. Sweets and desserts Chocolate and cocoa. Donuts. Seasoning and other foods Pepper. Peppermint and spearmint. Any condiments, herbs, or seasonings that cause symptoms. For some people, this may include curry, hot sauce, or vinegar-based salad dressings. Summary  When you have gastroesophageal reflux disease (GERD), food and lifestyle choices are very important to help ease the discomfort of GERD.  Eat frequent, small meals instead of three large meals each day. Eat your meals slowly, in a relaxed setting. Avoid bending over or lying down until 2-3 hours after eating.  Limit high-fat foods such as fatty meat or fried foods. This information is not intended to replace advice given to you by your health care provider. Make sure  you discuss any questions you have with your health care provider. Document Released: 01/06/2005 Document Revised: 01/08/2016 Document Reviewed: 01/08/2016 Elsevier Interactive Patient Education  [employer].

## 2017-07-28 ENCOUNTER — Encounter: Payer: Self-pay | Admitting: Family Medicine

## 2017-07-28 ENCOUNTER — Ambulatory Visit: Payer: 59 | Admitting: Family Medicine

## 2017-07-28 VITALS — BP 120/78 | HR 88 | Temp 98.2°F | Wt 209.0 lb

## 2017-07-28 DIAGNOSIS — B001 Herpesviral vesicular dermatitis: Secondary | ICD-10-CM | POA: Diagnosis not present

## 2017-07-28 DIAGNOSIS — K219 Gastro-esophageal reflux disease without esophagitis: Secondary | ICD-10-CM | POA: Diagnosis not present

## 2017-07-28 MED ORDER — OMEPRAZOLE 20 MG PO CPDR: 20.0000 mg | DELAYED_RELEASE_CAPSULE | Freq: Every day | ORAL | 2 refills | Status: DC

## 2017-07-28 MED ORDER — FAMCICLOVIR 500 MG PO TABS: 1500.0000 mg | ORAL_TABLET | Freq: Once | ORAL | 0 refills | Status: AC

## 2017-07-28 NOTE — Patient Instructions (Addendum)
Cold Sore A cold sore, also called a fever blister, is a skin infection that causes small, fluid-filled sores to form inside of the mouth or on the lips, gums, nose, chin, or cheeks. Cold sores can spread to other parts of the body, such as the eyes or fingers. In some people with other medical conditions, cold sores can spread to multiple other body sites, including the genitals. Cold sores can be spread or passed from person to person (contagious) until the sores crust over completely. What are the causes? Cold sores are caused by the herpes simplex virus (HSV-1). HSV-1 is closely related to the virus that causes genital herpes (HSV-2), but these viruses are not the same. Once a person is infected with HSV-1, the virus remains permanently in the body. HSV-1 is spread from person to person through close contact, such as through kissing, touching the affected area, or sharing personal items such as lip balm, razors, or eating utensils. What increases the risk? A cold sore outbreak is more likely to develop in people who:  Are tired, stressed, or sick.  Are menstruating.  Are pregnant.  Take certain medicines.  Are exposed to cold weather or too much sun.  What are the signs or symptoms? Symptoms of a cold sore outbreak often go through different stages. Here is how a cold sore develops:  Tingling, itching, or burning is felt 1-2 days before the outbreak.  Fluid-filled blisters appear on the lips, inside the mouth, on the nose, or on the cheeks.  The blisters start to ooze clear fluid.  The blisters dry up and a yellow crust appears in its place.  The crust falls off.  Other symptoms include:  Fever.  Sore throat.  Headache.  Muscle aches.  Swollen neck glands.  You also may not have any symptoms. How is this diagnosed? This condition is often diagnosed based on your medical history and a physical exam. Your health care provider may swab your sore and then examine it in  the lab. Rarely, blood tests may be done to check for HSV-1. How is this treated? There is no cure for cold sores or HSV-1. There also is no vaccine for HSV-1. Most cold sores go away on their own without treatment within two weeks. Medicines cannot make the infection go away, but medicines can:  Help relieve some of the pain associated with the sores.  Work to stop the virus from multiplying.  Shorten healing time.  Medicines may be in the form of creams, gels, pills, or a shot. Follow these instructions at home: Medicines  Take or apply over-the-counter and prescription medicines only as told by your health care provider.  Use a cotton-tip swab to apply creams or gels to your sores. Sore Care  Do not touch the sores or pick the scabs.  Wash your hands often. Do not touch your eyes without washing your hands first.  Keep the sores clean and dry.  If directed, apply ice to the sores: ? Put ice in a plastic bag. ? Place a towel between your skin and the bag. ? Leave the ice on for 20 minutes, 2-3 times per day. Lifestyle  Do not kiss, have oral sex, or share personal items until your sores heal.  Eat a soft, bland diet. Avoid eating hot, cold, or salty foods. These can hurt your mouth.  Use a straw if it hurts to drink out of a glass.  Avoid the sun and limit your stress if these things   trigger outbreaks. If sun causes cold sores, apply sunscreen on your lips before being out in the sun. Contact a health care provider if:  You have symptoms for more than two weeks.  You have pus coming from the sores.  You have redness that is spreading.  You have pain or irritation in your eye.  You get sores on your genitals.  Your sores do not heal within two weeks.  You have frequent cold sore outbreaks. Get help right away if:  You have a fever and your symptoms suddenly get worse.  You have a headache and confusion. This information is not intended to replace advice  given to you by your health care provider. Make sure you discuss any questions you have with your health care provider. Document Released: 01/04/2000 Document Revised: 08/31/2015 Document Reviewed: 10/27/2014 Elsevier Interactive Patient Education  2018 [employer].  Food Choices for Gastroesophageal Reflux Disease, Adult When you have gastroesophageal reflux disease (GERD), the foods you eat and your eating habits are very important. Choosing the right foods can help ease the discomfort of GERD. Consider working with a diet and nutrition specialist (dietitian) to help you make healthy food choices. What general guidelines should I follow? Eating plan  Choose healthy foods low in fat, such as fruits, vegetables, whole grains, low-fat dairy products, and lean meat, fish, and poultry.  Eat frequent, small meals instead of three large meals each day. Eat your meals slowly, in a relaxed setting. Avoid bending over or lying down until 2-3 hours after eating.  Limit high-fat foods such as fatty meats or fried foods.  Limit your intake of oils, butter, and shortening to less than 8 teaspoons each day.  Avoid the following: ? Foods that cause symptoms. These may be different for different people. Keep a food diary to keep track of foods that cause symptoms. ? Alcohol. ? Drinking large amounts of liquid with meals. ? Eating meals during the 2-3 hours before bed.  Cook foods using methods other than frying. This may include baking, grilling, or broiling. Lifestyle   Maintain a healthy weight. Ask your health care provider what weight is healthy for you. If you need to lose weight, work with your health care provider to do so safely.  Exercise for at least 30 minutes on 5 or more days each week, or as told by your health care provider.  Avoid wearing clothes that fit tightly around your waist and chest.  Do not use any products that contain nicotine or tobacco, such as cigarettes and  e-cigarettes. If you need help quitting, ask your health care provider.  Sleep with the head of your bed raised. Use a wedge under the mattress or blocks under the bed frame to raise the head of the bed. What foods are not recommended? The items listed may not be a complete list. Talk with your dietitian about what dietary choices are best for you. Grains Pastries or quick breads with added fat. [place] toast. Vegetables Deep fried vegetables. [place] fries. Any vegetables prepared with added fat. Any vegetables that cause symptoms. For some people this may include tomatoes and tomato products, chili peppers, onions and garlic, and horseradish. Fruits Any fruits prepared with added fat. Any fruits that cause symptoms. For some people this may include citrus fruits, such as oranges, grapefruit, pineapple, and lemons. Meats and other protein foods High-fat meats, such as fatty beef or pork, hot dogs, ribs, ham, sausage, salami and bacon. Fried meat or protein, including fried  fish and fried chicken. Nuts and nut butters. Dairy Whole milk and chocolate milk. Sour cream. Cream. Ice cream. Cream cheese. Milk shakes. Beverages Coffee and tea, with or without caffeine. Carbonated beverages. Sodas. Energy drinks. Fruit juice made with acidic fruits (such as orange or grapefruit). Tomato juice. Alcoholic drinks. Fats and oils Butter. Margarine. Shortening. Ghee. Sweets and desserts Chocolate and cocoa. Donuts. Seasoning and other foods Pepper. Peppermint and spearmint. Any condiments, herbs, or seasonings that cause symptoms. For some people, this may include curry, hot sauce, or vinegar-based salad dressings. Summary  When you have gastroesophageal reflux disease (GERD), food and lifestyle choices are very important to help ease the discomfort of GERD.  Eat frequent, small meals instead of three large meals each day. Eat your meals slowly, in a relaxed setting. Avoid bending over or lying down until  2-3 hours after eating.  Limit high-fat foods such as fatty meat or fried foods. This information is not intended to replace advice given to you by your health care provider. Make sure you discuss any questions you have with your health care provider. Document Released: 01/06/2005 Document Revised: 01/08/2016 Document Reviewed: 01/08/2016 Elsevier Interactive Patient Education  [employer]2018 Else[employer] Inc[employer]

## 2017-07-28 NOTE — Progress Notes (Signed)
Subjective:    Patient ID: Jimmy PotashMatthew David Bulman, male    DOB: 04/28/1990, 27 y.o.   MRN: [id number]  No chief complaint on file.   HPI Patient was seen today for acute concern.  Pt endorses cold sore x1 day.  Pt states it seems worse than his typical cold sores.  Endorses pain.  Pt typically uses Abreva.  Pt also requesting refill on Omeprazole.  Pt missed a few days of med and notes feeling symptoms.  Still unable to pinpoint specific foods that cause symptoms.  Past Medical History:  Diagnosis Date  . Bicuspid aortic valve     No Known Allergies  ROS General: Denies fever, chills, night sweats, changes in weight, changes in appetite HEENT: Denies headaches, ear pain, changes in vision, rhinorrhea, sore throat CV: Denies CP, palpitations, SOB, orthopnea Pulm: Denies SOB, cough, wheezing GI: Denies abdominal pain, nausea, vomiting, diarrhea, constipation  +heart burn GU: Denies dysuria, hematuria, frequency, vaginal discharge Msk: Denies muscle cramps, joint pains Neuro: Denies weakness, numbness, tingling Skin: Denies rashes, bruising  +cold sore Psych: Denies depression, anxiety, hallucinations     Objective:    Blood pressure 120/78, pulse 88, temperature 98.2 F (36.8 C), temperature source Oral, weight 209 lb (94.8 kg), SpO2 98 %.   Gen. Pleasant, well-nourished, in no distress, normal affect   HEENT: [place]/AT, face symmetric, herpes labialis blisters on L lower lip with mild erythema.  no scleral icterus, PERRLA, nares patent without drainage  Lungs: no accessory muscle use, CTAB, no wheezes or rales Cardiovascular: RRR,  no peripheral edema Neuro:  A&Ox3, CN II-XII intact, normal gait   Wt Readings from Last 3 Encounters:  07/28/17 209 lb (94.8 kg)  05/20/17 206 lb (93.4 kg)  03/27/17 205 lb (93 kg)    Lab Results  Component Value Date   WBC 5.7 03/27/2017   HGB 14.6 03/27/2017   HCT 42.8 03/27/2017   PLT 243.0 03/27/2017   GLUCOSE 88 03/27/2017   ALT 13  03/27/2017   AST 13 03/27/2017   NA 138 03/27/2017   K 4.1 03/27/2017   CL 101 03/27/2017   CREATININE 0.87 03/27/2017   BUN 14 03/27/2017   CO2 30 03/27/2017    Assessment/Plan:  Cold sore  -Given handout - Plan: famciclovir (FAMVIR) 500 MG tablet.  Take 1500 mg as a single dose once.  Gastroesophageal reflux disease, esophagitis presence not specified  - Plan: omeprazole (PRILOSEC) 20 MG capsule  Follow-up PRN  Abbe AmsterdamShannon Odalys Win, MD

## 2017-07-29 ENCOUNTER — Ambulatory Visit: Payer: 59 | Admitting: Family Medicine

## 2017-08-29 ENCOUNTER — Other Ambulatory Visit: Payer: Self-pay | Admitting: Family Medicine

## 2017-08-29 DIAGNOSIS — K219 Gastro-esophageal reflux disease without esophagitis: Secondary | ICD-10-CM

## 2017-10-16 ENCOUNTER — Ambulatory Visit (INDEPENDENT_AMBULATORY_CARE_PROVIDER_SITE_OTHER): Payer: PRIVATE HEALTH INSURANCE | Admitting: Family Medicine

## 2017-10-16 ENCOUNTER — Encounter: Payer: Self-pay | Admitting: Family Medicine

## 2017-10-16 VITALS — BP 116/82 | HR 90 | Temp 98.0°F | Wt 205.0 lb

## 2017-10-16 DIAGNOSIS — Z13220 Encounter for screening for lipoid disorders: Secondary | ICD-10-CM | POA: Diagnosis not present

## 2017-10-16 DIAGNOSIS — Z131 Encounter for screening for diabetes mellitus: Secondary | ICD-10-CM

## 2017-10-16 DIAGNOSIS — R350 Frequency of micturition: Secondary | ICD-10-CM

## 2017-10-16 DIAGNOSIS — Z0000 Encounter for general adult medical examination without abnormal findings: Secondary | ICD-10-CM

## 2017-10-16 DIAGNOSIS — Z23 Encounter for immunization: Secondary | ICD-10-CM | POA: Diagnosis not present

## 2017-10-16 LAB — URINALYSIS
KETONES UR: NEGATIVE
LEUKOCYTES UA: NEGATIVE
Nitrite: NEGATIVE
Specific Gravity, Urine: 1.02 (ref 1.000–1.030)
Total Protein, Urine: NEGATIVE
URINE GLUCOSE: NEGATIVE
UROBILINOGEN UA: 0.2 (ref 0.0–1.0)
pH: 6 (ref 5.0–8.0)

## 2017-10-16 LAB — LIPID PANEL
Cholesterol: 173 mg/dL (ref 0–200)
HDL: 34.1 mg/dL — ABNORMAL LOW (ref >39.00)
LDL Cholesterol: 116 mg/dL — ABNORMAL HIGH (ref 0–99)
NONHDL: 138.99
Total CHOL/HDL Ratio: 5
Triglycerides: 115 mg/dL (ref 0.0–149.0)
VLDL: 23 mg/dL (ref 0.0–40.0)

## 2017-10-16 LAB — HEMOGLOBIN A1C: HEMOGLOBIN A1C: 5.2 % (ref 4.6–6.5)

## 2017-10-16 NOTE — Patient Instructions (Signed)
Preventive Care 18-39 Years, Male Preventive care refers to lifestyle choices and visits with your health care provider that can promote health and wellness. What does preventive care include?  A yearly physical exam. This is also called an annual well check.  Dental exams once or twice a year.  Routine eye exams. Ask your health care provider how often you should have your eyes checked.  Personal lifestyle choices, including: ? Daily care of your teeth and gums. ? Regular physical activity. ? Eating a healthy diet. ? Avoiding tobacco and drug use. ? Limiting alcohol use. ? Practicing safe sex. What happens during an annual well check? The services and screenings done by your health care provider during your annual well check will depend on your age, overall health, lifestyle risk factors, and family history of disease. Counseling Your health care provider may ask you questions about your:  Alcohol use.  Tobacco use.  Drug use.  Emotional well-being.  Home and relationship well-being.  Sexual activity.  Eating habits.  Work and work Statistician.  Screening You may have the following tests or measurements:  Height, weight, and BMI.  Blood pressure.  Lipid and cholesterol levels. These may be checked every 5 years starting at age 34.  Diabetes screening. This is done by checking your blood sugar (glucose) after you have not eaten for a while (fasting).  Skin check.  Hepatitis C blood test.  Hepatitis B blood test.  Sexually transmitted disease (STD) testing.  Discuss your test results, treatment options, and if necessary, the need for more tests with your health care provider. Vaccines Your health care provider may recommend certain vaccines, such as:  Influenza vaccine. This is recommended every year.  Tetanus, diphtheria, and acellular pertussis (Tdap, Td) vaccine. You may need a Td booster every 10 years.  Varicella vaccine. You may need this if you  have not been vaccinated.  HPV vaccine. If you are 23 or younger, you may need three doses over 6 months.  Measles, mumps, and rubella (MMR) vaccine. You may need at least one dose of MMR.You may also need a second dose.  Pneumococcal 13-valent conjugate (PCV13) vaccine. You may need this if you have certain conditions and have not been vaccinated.  Pneumococcal polysaccharide (PPSV23) vaccine. You may need one or two doses if you smoke cigarettes or if you have certain conditions.  Meningococcal vaccine. One dose is recommended if you are age 65-21 years and a first-year college student living in a residence hall, or if you have one of several medical conditions. You may also need additional booster doses.  Hepatitis A vaccine. You may need this if you have certain conditions or if you travel or work in places where you may be exposed to hepatitis A.  Hepatitis B vaccine. You may need this if you have certain conditions or if you travel or work in places where you may be exposed to hepatitis B.  Haemophilus influenzae type b (Hib) vaccine. You may need this if you have certain risk factors.  Talk to your health care provider about which screenings and vaccines you need and how often you need them. This information is not intended to replace advice given to you by your health care provider. Make sure you discuss any questions you have with your health care provider. Document Released: 03/04/2001 Document Revised: 09/26/2015 Document Reviewed: 11/07/2014 Elsevier Interactive Patient Education  [employer].

## 2017-10-16 NOTE — Progress Notes (Signed)
Subjective:     Jimmy Russo is a 27 y.o. male and is here for a comprehensive physical exam. The patient reports no problems.  Pt does note increased urination, may go 2-3 x per during day.  States has been drinking a little more water than normal.  Also notes more regular BMs.  Has been eating healthier.  Social History   Socioeconomic History  . Marital status: Single    Spouse name: Not on file  . Number of children: Not on file  . Years of education: Not on file  . Highest education level: Not on file  Occupational History  . Not on file  Social Needs  . Financial resource strain: Not on file  . Food insecurity:    Worry: Not on file    Inability: Not on file  . Transportation needs:    Medical: Not on file    Non-medical: Not on file  Tobacco Use  . Smoking status: Former Games developer  . Smokeless tobacco: Former Neurosurgeon    Types: Chew  Substance and Sexual Activity  . Alcohol use: Yes    Comment: ocass  . Drug use: No  . Sexual activity: Not on file  Lifestyle  . Physical activity:    Days per week: Not on file    Minutes per session: Not on file  . Stress: Not on file  Relationships  . Social connections:    Talks on phone: Not on file    Gets together: Not on file    Attends religious service: Not on file    Active member of club or organization: Not on file    Attends meetings of clubs or organizations: Not on file    Relationship status: Not on file  . Intimate partner violence:    Fear of current or ex partner: Not on file    Emotionally abused: Not on file    Physically abused: Not on file    Forced sexual activity: Not on file  Other Topics Concern  . Not on file  Social History Narrative  . Not on file   Health Maintenance  Topic Date Due  . HIV Screening  08/20/2005  . TETANUS/TDAP  08/20/2009  . INFLUENZA VACCINE  08/20/2017    The following portions of the patient's history were reviewed and updated as appropriate: allergies, current  medications, past family history, past medical history, past social history, past surgical history and problem list.  Review of Systems A comprehensive review of systems was negative.   Objective:    BP 116/82 (BP Location: Left Arm, Patient Position: Sitting, Cuff Size: Normal)   Pulse 90   Temp 98 F (36.7 C) (Oral)   Wt 205 lb (93 kg)   SpO2 98%   BMI 31.17 kg/m  General appearance: alert, cooperative, appears stated age and no distress Head: Normocephalic, without obvious abnormality, atraumatic Eyes: conjunctivae/corneas clear. PERRL, EOM's intact. Fundi benign. Ears: normal TM's and external ear canals both ears Nose: Nares normal. Septum midline. Mucosa normal. No drainage or sinus tenderness. Throat: lips, mucosa, and tongue normal; teeth and gums normal Neck: no adenopathy, no carotid bruit, no JVD, supple, symmetrical, trachea midline and thyroid not enlarged, symmetric, no tenderness/mass/nodules Lungs: clear to auscultation bilaterally Heart: regular rate and rhythm, S1, S2 normal, no murmur, click, rub or gallop Abdomen: soft, non-tender; bowel sounds normal; no masses,  no organomegaly Extremities: extremities normal, atraumatic, no cyanosis or edema Skin: Skin color, texture, turgor normal. No rashes or  lesions Neurologic: Alert and oriented X 3, normal strength and tone. Normal symmetric reflexes. Normal coordination and gait    Assessment:    Healthy male exam.      Plan:     Anticipatory guidance given including wearing seatbelts, smoke detectors in the home, increasing physical activity, increasing p.o. intake of water and vegetables. -will obtain UA, lipids, and hgb A1C -given handout See After Visit Summary for Counseling Recommendations    Need for influenza vaccine -given flu vaccine this visit  F/u prn  Abbe Amsterdam, MD

## 2017-10-16 NOTE — Addendum Note (Signed)
Addended by: Carola Rhine on: 10/16/2017 09:45 AM   Modules accepted: Orders

## 2017-11-30 ENCOUNTER — Ambulatory Visit (INDEPENDENT_AMBULATORY_CARE_PROVIDER_SITE_OTHER): Payer: PRIVATE HEALTH INSURANCE | Admitting: Family Medicine

## 2017-11-30 ENCOUNTER — Encounter: Payer: Self-pay | Admitting: Family Medicine

## 2017-11-30 VITALS — BP 126/80 | HR 98 | Temp 99.2°F | Wt 202.0 lb

## 2017-11-30 DIAGNOSIS — H9311 Tinnitus, right ear: Secondary | ICD-10-CM

## 2017-11-30 NOTE — Progress Notes (Signed)
Subjective:    Patient ID: Jimmy Russo, male    DOB: November 06, 1990, 27 y.o.   MRN: [id number]  No chief complaint on file.   HPI Patient was seen today for acute concern.  Pt endorses ringing in ears since Friday.  Pt initially noted mild soreness which has now resolved.  Pt denies recent viral illness, use of ASA, HA, fever, chills, rhinorrhea, ST.  Past Medical History:  Diagnosis Date  . Bicuspid aortic valve     No Known Allergies  ROS General: Denies fever, chills, night sweats, changes in weight, changes in appetite HEENT: Denies headaches, ear pain, changes in vision, rhinorrhea, sore throat  +tinnitus CV: Denies CP, palpitations, SOB, orthopnea Pulm: Denies SOB, cough, wheezing GI: Denies abdominal pain, nausea, vomiting, diarrhea, constipation GU: Denies dysuria, hematuria, frequency, vaginal discharge Msk: Denies muscle cramps, joint pains Neuro: Denies weakness, numbness, tingling Skin: Denies rashes, bruising Psych: Denies depression, anxiety, hallucinations    Objective:    Blood pressure 126/80, pulse 98, temperature 99.2 F (37.3 C), temperature source Oral, weight 202 lb (91.6 kg), SpO2 98 %.  Pulse initially elevated at 102.  Gen. Pleasant, well-nourished, in no distress, normal affect   HEENT: [place]/AT, face symmetric, no scleral icterus, PERRLA, EOMI, nares patent without drainage.  TMs normal b/l.  R TM with 2 pieces of hair against the surface.  Attempt to remove hair via irrigation unsuccessful.  No cerumen noted in canals.  Normal rinne and Weber tests. Lungs: no accessory muscle use Cardiovascular: RRR, no peripheral edema Neuro:  A&Ox3, CN II-XII intact, normal gait  Wt Readings from Last 3 Encounters:  11/30/17 202 lb (91.6 kg)  10/16/17 205 lb (93 kg)  07/28/17 209 lb (94.8 kg)    Lab Results  Component Value Date   WBC 5.7 03/27/2017   HGB 14.6 03/27/2017   HCT 42.8 03/27/2017   PLT 243.0 03/27/2017   GLUCOSE 88 03/27/2017   CHOL 173  10/16/2017   TRIG 115.0 10/16/2017   HDL 34.10 (L) 10/16/2017   LDLCALC 116 (H) 10/16/2017   ALT 13 03/27/2017   AST 13 03/27/2017   NA 138 03/27/2017   K 4.1 03/27/2017   CL 101 03/27/2017   CREATININE 0.87 03/27/2017   BUN 14 03/27/2017   CO2 30 03/27/2017   HGBA1C 5.2 10/16/2017    Assessment/Plan:  Tinnitus of right ear  -reassured as hearing normal. -consent obtained.  R ear flushed in an attempt to remove hairs from TM.  Hairs remained after irrigation. Pt tolerated procedure well. -given handout -consider ENT referral for continued symptoms. -pt given RTC precautions for fever, pain, increased pressure, etc.  F/u prn  Abbe Amsterdam, MD

## 2017-11-30 NOTE — Patient Instructions (Signed)
Tinnitus Tinnitus refers to hearing a sound when there is no actual source for that sound. This is often described as ringing in the ears. However, people with this condition may hear a variety of noises. A person may hear the sound in one ear or in both ears. The sounds of tinnitus can be soft, loud, or somewhere in between. Tinnitus can last for a few seconds or can be constant for days. It may go away without treatment and come back at various times. When tinnitus is constant or happens often, it can lead to other problems, such as trouble sleeping and trouble concentrating. Almost everyone experiences tinnitus at some point. Tinnitus that is long-lasting (chronic) or comes back often is a problem that may require medical attention. What are the causes? The cause of tinnitus is often not known. In some cases, it can result from other problems or conditions, including:  Exposure to loud noises from machinery, music, or other sources.  Hearing loss.  Ear or sinus infections.  Earwax buildup.  A foreign object in the ear.  Use of certain medicines.  Use of alcohol and caffeine.  High blood pressure.  Heart diseases.  Anemia.  Allergies.  Meniere disease.  Thyroid problems.  Tumors.  An enlarged part of a weakened blood vessel (aneurysm).  What are the signs or symptoms? The main symptom of tinnitus is hearing a sound when there is no source for that sound. It may sound like:  Buzzing.  Roaring.  Ringing.  Blowing air, similar to the sound heard when you listen to a seashell.  Hissing.  Whistling.  Sizzling.  Humming.  Running water.  A sustained musical note.  How is this diagnosed? Tinnitus is diagnosed based on your symptoms. Your health care provider will do a physical exam. A comprehensive hearing exam (audiologic exam) will be done if your tinnitus:  Affects only one ear (unilateral).  Causes hearing difficulties.  Lasts 6 months or  longer.  You may also need to see a health care provider who specializes in hearing disorders (audiologist). You may be asked to complete a questionnaire to determine the severity of your tinnitus. Tests may be done to help determine the cause and to rule out other conditions. These can include:  Imaging studies of your head and brain, such as: ? A CT scan. ? An MRI.  An imaging study of your blood vessels (angiogram).  How is this treated? Treating an underlying medical condition can sometimes make tinnitus go away. If your tinnitus continues, other treatments may include:  Medicines, such as certain antidepressants or sleeping aids.  Sound generators to mask the tinnitus. These include: ? Tabletop sound machines that play relaxing sounds to help you fall asleep. ? Wearable devices that fit in your ear and play sounds or music. ? A small device that uses headphones to deliver a signal embedded in music (acoustic neural stimulation). In time, this may change the pathways of your brain and make you less sensitive to tinnitus. This device is used for very severe cases when no other treatment is working.  Therapy and counseling to help you manage the stress of living with tinnitus.  Using hearing aids or cochlear implants, if your tinnitus is related to hearing loss.  Follow these instructions at home:  When possible, avoid being in loud places and being exposed to loud sounds.  Wear hearing protection, such as earplugs, when you are exposed to loud noises.  Do not take stimulants, such as nicotine,   alcohol, or caffeine.  Practice techniques for reducing stress, such as meditation, yoga, or deep breathing.  Use a white noise machine, a humidifier, or other devices to mask the sound of tinnitus.  Sleep with your head slightly raised. This may reduce the impact of tinnitus.  Try to get plenty of rest each night. Contact a health care provider if:  You have tinnitus in just one  ear.  Your tinnitus continues for 3 weeks or longer without stopping.  Home care measures are not helping.  You have tinnitus after a head injury.  You have tinnitus along with any of the following: ? Dizziness. ? Loss of balance. ? Nausea and vomiting. This information is not intended to replace advice given to you by your health care provider. Make sure you discuss any questions you have with your health care provider. Document Released: 01/06/2005 Document Revised: 09/09/2015 Document Reviewed: 06/08/2013 Elsevier Interactive Patient Education  2018 Elsevier Inc.  

## 2017-12-04 ENCOUNTER — Telehealth: Payer: Self-pay

## 2017-12-04 ENCOUNTER — Other Ambulatory Visit: Payer: Self-pay | Admitting: Family Medicine

## 2017-12-04 DIAGNOSIS — H9311 Tinnitus, right ear: Secondary | ICD-10-CM

## 2017-12-04 NOTE — Telephone Encounter (Signed)
Copied from CRM [id number]. Topic: Referral - Request for Referral >> Dec 04, 2017  3:01 PM Crist InfanteHarrald, Kathy J wrote: Has patient seen PCP for this complaint? Yes pt seen the other day Pt states the ringing in his ear is not better and he would like referral to ENT.

## 2017-12-04 NOTE — Telephone Encounter (Signed)
Ok for ENT Referral for his ringing of his ear

## 2017-12-04 NOTE — Telephone Encounter (Signed)
Please advise 

## 2017-12-04 NOTE — Telephone Encounter (Signed)
Ok to refer to ENT

## 2017-12-07 ENCOUNTER — Ambulatory Visit: Payer: PRIVATE HEALTH INSURANCE | Admitting: Family Medicine

## 2017-12-07 NOTE — Telephone Encounter (Signed)
ENT referral has been placed, pt is aware

## 2017-12-16 ENCOUNTER — Ambulatory Visit: Payer: Self-pay

## 2017-12-16 NOTE — Telephone Encounter (Signed)
Rec'd call from pt. with c/o BRB per rectum with recent bowel movements.  Reported the blood appears to be on surface of the stool only, and on the toilet tissue.  Stated the "initial episode was on Saturday or Sunday", and has decreased in amt. with subsequent episodes.  Reported has had about 2 BM's per day with intermittent BRB noted.  Stated the BM this morning was normal color.  Denied any change in color of the toilet water, denied any passing any clots, or passing blood without stool. Stated he has intermittent abdominal cramping that he usually has; "nothing out of the ordinary."  Denied abdominal pain, fever/ chills, or nausea/ vomiting.  Stated he has some "raw feeling and itching" at anus, with passing stools.  Reported that he has been on vacation, and not eating as well; has had intermittent hard-formed stools.  Is traveling, and is due to fly home on Saturday.  Appt. Sched. 12/3 with PCP.  Care advice given per protocol.  Advised to seek care sooner if increased BRB per rectum, or dizziness/ weakness occurs.  Verb. Understanding.  Agrees with plan.         Reason for Disposition . MILD rectal bleeding (more than just a few drops or streaks)  Answer Assessment - Initial Assessment Questions 1. APPEARANCE of BLOOD: "What color is it?" "Is it passed separately, on the surface of the stool, or mixed in with the stool?"      On surface of stool and on toilet tissue  2. AMOUNT: "How much blood was passed?"      Initial episode was blood on stool and on toilet tissue; has had been decreased amt with subsequent bm's   3. FREQUENCY: "How many times has blood been passed with the stools?"      Has occurred about 4-5 times since onset on Saturday; has approx. 2 stools/ day 4. ONSET: "When was the blood first seen in the stools?" (Days or weeks)     On Saturday night  5. DIARRHEA: "Is there also some diarrhea?" If so, ask: "How many diarrhea stools were passed in past 24 hours?"      Denied 6.  CONSTIPATION: "Do you have constipation?" If so, "How bad is it?"    Formed and  Intermittent hard formed stools 7. RECURRENT SYMPTOMS: "Have you had blood in your stools before?" If so, ask: "When was the last time?" and "What happened that time?"      Denied prev. episodes  8. BLOOD THINNERS: "Do you take any blood thinners?" (e.g., Coumadin/warfarin, Pradaxa/dabigatran, aspirin)     Denied  9. OTHER SYMPTOMS: "Do you have any other symptoms?"  (e.g., abdominal pain, vomiting, dizziness, fever)     Denied abdominal pain; intermittent abd. Cramping; denied nausea or vomiting; no fever/chills ; some raw and itching feeling at anus  Protocols used: RECTAL BLEEDING-A-AH

## 2017-12-22 ENCOUNTER — Ambulatory Visit (INDEPENDENT_AMBULATORY_CARE_PROVIDER_SITE_OTHER): Payer: PRIVATE HEALTH INSURANCE | Admitting: Family Medicine

## 2017-12-22 ENCOUNTER — Encounter: Payer: Self-pay | Admitting: Family Medicine

## 2017-12-22 VITALS — BP 124/86 | HR 91 | Temp 98.4°F | Wt 206.0 lb

## 2017-12-22 DIAGNOSIS — B9789 Other viral agents as the cause of diseases classified elsewhere: Secondary | ICD-10-CM

## 2017-12-22 DIAGNOSIS — J069 Acute upper respiratory infection, unspecified: Secondary | ICD-10-CM

## 2017-12-22 DIAGNOSIS — K625 Hemorrhage of anus and rectum: Secondary | ICD-10-CM | POA: Diagnosis not present

## 2017-12-22 MED ORDER — BENZONATATE 100 MG PO CAPS: 100.0000 mg | ORAL_CAPSULE | Freq: Two times a day (BID) | ORAL | 0 refills | Status: DC | PRN

## 2017-12-22 NOTE — Progress Notes (Signed)
Subjective:    Patient ID: Jimmy Russo, male    DOB: 07/01/1990, 27 y.o.   MRN: [id number]  No chief complaint on file.   HPI Patient was seen today for ongoing concerns.    BRBPR: -noticed last Tues a "large amount of blood" on toilet paper and in toilet  -progressively improved with BMs.   -streaks of blood noted in evening -noticed harder small caliber pellets in evening. -pt endorses some constipation -drinking more water -no hx of hemorrhoids -no family hx of colon cancer.  Cold symptoms -pt endorses stuffy nose, sinus pressure, subjective fever, chills, some sore throat, headache since Friday -Feels like symptoms are improving some. -Occasional cough at times productive -Endorses rib pain with coughing on lower left side. -Trying to stay hydrated.   -Taking OTC meds -Possible sick contacts include people on his flight out of town.  Of note patient feels like tinnitus is improving.  He has been seen by ENT. Past Medical History:  Diagnosis Date  . Bicuspid aortic valve     No Known Allergies  ROS General: Denies night sweats, changes in weight, changes in appetite  +subjective fever, chills HEENT: Denies ear pain, changes in vision, rhinorrhea  +nasal congestion, sore throat, HA, sinus pressure CV: Denies CP, palpitations, SOB, orthopnea Pulm: Denies SOB, wheezing  +cough GI: Denies abdominal pain, nausea, vomiting, diarrhea  +constipation, BRBPR GU: Denies dysuria, hematuria, frequency, vaginal discharge Msk: Denies muscle cramps, joint pains Neuro: Denies weakness, numbness, tingling Skin: Denies rashes, bruising Psych: Denies depression, anxiety, hallucinations    Objective:    Blood pressure 124/86, pulse 91, temperature 98.4 F (36.9 C), temperature source Oral, weight 206 lb (93.4 kg), SpO2 97 %.  Gen. Pleasant, well-nourished, in no distress, normal affect   HEENT: [place]/AT, face symmetric, no scleral icterus, PERRLA, nares patent without  drainage, pharynx with mild erythema, no exudate.  TMs full b/l.  No TTP of sinues.  No cervical lymphadenopathy. Lungs: no accessory muscle use, CTAB, no wheezes or rales Cardiovascular: RRR, no m/r/g, no peripheral edema Rectal: normal appearing anus, mild erythema of skin surrounding anus.  No hemorrhoids present.  No internal hemorrhoids appreciated, normal rectal tone, no stool in rectal vault.  Stool guaiac card positive.  Chaperone present: Mable Paris, CMA Neuro:  A&Ox3, CN II-XII intact, normal gait  Wt Readings from Last 3 Encounters:  12/22/17 206 lb (93.4 kg)  11/30/17 202 lb (91.6 kg)  10/16/17 205 lb (93 kg)    Lab Results  Component Value Date   WBC 5.7 03/27/2017   HGB 14.6 03/27/2017   HCT 42.8 03/27/2017   PLT 243.0 03/27/2017   GLUCOSE 88 03/27/2017   CHOL 173 10/16/2017   TRIG 115.0 10/16/2017   HDL 34.10 (L) 10/16/2017   LDLCALC 116 (H) 10/16/2017   ALT 13 03/27/2017   AST 13 03/27/2017   NA 138 03/27/2017   K 4.1 03/27/2017   CL 101 03/27/2017   CREATININE 0.87 03/27/2017   BUN 14 03/27/2017   CO2 30 03/27/2017   HGBA1C 5.2 10/16/2017    Assessment/Plan:  Viral URI with cough  -Continue supportive care -Given handout -Discussed follow-up for continued or worsening symptoms as could progress into sinus infection. - Plan: benzonatate (TESSALON) 100 MG capsule  BRBPR (bright red blood per rectum) -Discussed possible causes including constipation causing fissure,, medications, infection, etc. -Likely 2/2 patient causing fissure -small amt of blood noted on stool guaiac card -Discussed increasing p.o. intake of water and vegetables -Consider  stool softener such as Colace -Patient given RTC or ED precautions for continued symptoms -If symptoms continue consider GI referral  Follow-up PRN  Abbe AmsterdamShannon , MD

## 2017-12-22 NOTE — Patient Instructions (Addendum)
Rectal Bleeding Rectal bleeding is when blood comes out of the opening of the butt (anus). People with this kind of bleeding may notice bright red blood in their underwear or in the toilet after they poop (have a bowel movement). They may also have dark red or black poop (stool). Rectal bleeding is often a sign that something is wrong. It needs to be checked by a doctor. Follow these instructions at home: Watch for any changes in your condition. Take these actions to help with bleeding and discomfort:  Eat a diet that is high in fiber. This will keep your poop soft so it is easier for you to poop without pushing too hard. Ask your doctor to tell you what foods and drinks are high in fiber.  Drink enough fluid to keep your pee (urine) clear or pale yellow. This also helps keep your poop soft.  Try taking a warm bath. This may help with pain.  Keep all follow-up visits as told by your doctor. This is important.  Get help right away if:  You have new bleeding.  You have more bleeding than before.  You have black or dark red poop.  You throw up (vomit) blood or something that looks like coffee grounds.  You have pain or tenderness in your belly (abdomen).  You have a fever.  You feel weak.  You feel sick to your stomach (nauseous).  You pass out (faint).  You have very bad pain in your butt.  You cannot poop. This information is not intended to replace advice given to you by your health care provider. Make sure you discuss any questions you have with your health care provider. Document Released: 09/18/2010 Document Revised: 06/14/2015 Document Reviewed: 03/04/2015 Elsevier Interactive Patient Education  [employer]2018 Elsevier Inc.  Constipation, Adult Constipation is when a person has fewer bowel movements in a week than normal, has difficulty having a bowel movement, or has stools that are dry, hard, or larger than normal. Constipation may be caused by an underlying condition. It may  become worse with age if a person takes certain medicines and does not take in enough fluids. Follow these instructions at home: Eating and drinking   Eat foods that have a lot of fiber, such as fresh fruits and vegetables, whole grains, and beans.  Limit foods that are high in fat, low in fiber, or overly processed, such as french fries, hamburgers, cookies, candies, and soda.  Drink enough fluid to keep your urine clear or pale yellow. General instructions  Exercise regularly or as told by your health care provider.  Go to the restroom when you have the urge to go. Do not hold it in.  Take over-the-counter and prescription medicines only as told by your health care provider. These include any fiber supplements.  Practice pelvic floor retraining exercises, such as deep breathing while relaxing the lower abdomen and pelvic floor relaxation during bowel movements.  Watch your condition for any changes.  Keep all follow-up visits as told by your health care provider. This is important. Contact a health care provider if:  You have pain that gets worse.  You have a fever.  You do not have a bowel movement after 4 days.  You vomit.  You are not hungry.  You lose weight.  You are bleeding from the anus.  You have thin, pencil-like stools. Get help right away if:  You have a fever and your symptoms suddenly get worse.  You leak stool or have blood  in your stool.  Your abdomen is bloated.  You have severe pain in your abdomen.  You feel dizzy or you faint. This information is not intended to replace advice given to you by your health care provider. Make sure you discuss any questions you have with your health care provider. Document Released: 10/05/2003 Document Revised: 07/27/2015 Document Reviewed: 06/27/2015 Elsevier Interactive Patient Education  2018 [employer].

## 2018-03-18 ENCOUNTER — Telehealth: Payer: Self-pay | Admitting: *Deleted

## 2018-03-18 NOTE — Telephone Encounter (Signed)
Copied from CRM [id number]. Topic: Referral - Request for Referral >> Mar 18, 2018  4:02 PM Baldo Daub L wrote: Has patient seen PCP for this complaint? yes *If NO, is insurance requiring patient see PCP for this issue before PCP can refer them? Referral for which specialty: audiologist Preferred provider/office: [facility] in [facility] Reason for referral: couldn't get in with audiologist from before.  Pt can be reached at [phone]

## 2018-03-22 ENCOUNTER — Other Ambulatory Visit: Payer: Self-pay | Admitting: Family Medicine

## 2018-03-22 DIAGNOSIS — H9311 Tinnitus, right ear: Secondary | ICD-10-CM

## 2018-03-22 NOTE — Telephone Encounter (Signed)
Referral placed per pt request 

## 2018-05-11 ENCOUNTER — Telehealth: Payer: Self-pay | Admitting: Cardiology

## 2018-05-11 NOTE — Telephone Encounter (Signed)
LVM/Calledx3(called mom phone as well) for pre reg

## 2018-05-11 NOTE — Progress Notes (Signed)
Virtual Visit via Video Note   This visit type was conducted due to national recommendations for restrictions regarding the COVID-19 Pandemic (e.g. social distancing) in an effort to limit this patient's exposure and mitigate transmission in our community.  Due to his co-morbid illnesses, this patient is at least at moderate risk for complications without adequate follow up.  This format is felt to be most appropriate for this patient at this time.  All issues noted in this document were discussed and addressed.  A limited physical exam was performed with this format.  Please refer to the patient's chart for his consent to telehealth for [facility].   Evaluation Performed:  Follow-up visit  Date:  05/12/2018   ID:  Jimmy Russo, DOB 04/09/1990, MRN [id number]  Patient Location: Home Provider Location: Home  PCP:  Deeann SaintBanks, Shannon R, MD  Cardiologist:  Rollene RotundaJames Eliya Geiman, MD  Electrophysiologist:  None   Chief Complaint:  Aortic stenosis  History of Present Illness:    Jimmy Russo is a 28 y.o. male who presents for evaluation of bicuspid aortic valve.  The patient says he is known about this since he was born. [place] was in [place].  He had an echo in 2018 with a bicuspid valve and mild AS.  He had mild aortic dilatation of the aortic root on CT.  There was no evidence of coarctation.    Since I last saw him he has done well.  He denies any cardiovascular symptoms such as chest pressure, neck or arm discomfort.  He has no palpitations, presyncope or syncope.  He wants to start doing a little bit more of an exercise regimen.  He works from home and is very careful about COVID exposure.  The patient does not have symptoms concerning for COVID-19 infection (fever, chills, cough, or new shortness of breath).    Past Medical History:  Diagnosis Date  . Bicuspid aortic valve    Past Surgical History:  Procedure Laterality Date  . HYDROCELE EXCISION       Current Meds   Medication Sig  . omeprazole (PRILOSEC) 20 MG capsule Take 1 capsule (20 mg total) by mouth daily.     Allergies:   Patient has no known allergies.   Social History   Tobacco Use  . Smoking status: Former Games developermoker  . Smokeless tobacco: Former NeurosurgeonUser    Types: Chew  Substance Use Topics  . Alcohol use: Yes    Comment: ocass  . Drug use: No     Family Hx: The patient's family history includes Hyperlipidemia in his father.  ROS:   Please see the history of present illness.    As stated in the HPI and negative for all other systems.   Prior CV studies:   The following studies were reviewed today:  Echo and CT  Labs/Other Tests and Data Reviewed:    EKG:  No ECG reviewed.  Recent Labs: No results found for requested labs within last 8760 hours.   Recent Lipid Panel Lab Results  Component Value Date/Time   CHOL 173 10/16/2017 09:32 AM   TRIG 115.0 10/16/2017 09:32 AM   HDL 34.10 (L) 10/16/2017 09:32 AM   CHOLHDL 5 10/16/2017 09:32 AM   LDLCALC 116 (H) 10/16/2017 09:32 AM    Wt Readings from Last 3 Encounters:  05/12/18 195 lb (88.5 kg)  12/22/17 206 lb (93.4 kg)  11/30/17 202 lb (91.6 kg)     Objective:    Vital Signs:  Ht  5\' 9"  (1.753 m)   Wt 195 lb (88.5 kg)   BMI 28.80 kg/m    VITAL SIGNS:  reviewed GEN:  no acute distress EYES:  sclerae anicteric, EOMI - Extraocular Movements Intact NEURO:  alert and oriented x 3, no obvious focal deficit PSYCH:  normal affect  ASSESSMENT & PLAN:    BICUSPID AORTIC VALVE:    We discussed this at length.  I reviewed with him in detail the anatomy physiology of his aortic valve.  No further imaging is indicated at this point.  I will see him back in the office in a year.  We discussed the symptoms that could evolve if this were to get more significant between now and then.  AORTIC ROOT DILATATION:   This is mildly enlarged and I will follow this up with further imaging as appropriate in the future.  COVID-19  Education: The signs and symptoms of COVID-19 were discussed with the patient and how to seek care for testing (follow up with PCP or arrange E-visit).   The importance of social distancing was discussed today.  Time:   Today, I have spent 16 minutes with the patient with telehealth technology discussing the above problems.     Medication Adjustments/Labs and Tests Ordered: Current medicines are reviewed at length with the patient today.  Concerns regarding medicines are outlined above.   Tests Ordered: No orders of the defined types were placed in this encounter.   Medication Changes: No orders of the defined types were placed in this encounter.   Disposition:  Follow up 12 months  Signed, Rollene Rotunda, MD  05/12/2018 12:20 PM     [facility]

## 2018-05-11 NOTE — Telephone Encounter (Signed)
LVM for pre reg °

## 2018-05-12 ENCOUNTER — Telehealth (INDEPENDENT_AMBULATORY_CARE_PROVIDER_SITE_OTHER): Payer: PRIVATE HEALTH INSURANCE | Admitting: Cardiology

## 2018-05-12 VITALS — Ht 69.0 in | Wt 195.0 lb

## 2018-05-12 DIAGNOSIS — Z7189 Other specified counseling: Secondary | ICD-10-CM | POA: Insufficient documentation

## 2018-05-12 DIAGNOSIS — I350 Nonrheumatic aortic (valve) stenosis: Secondary | ICD-10-CM | POA: Insufficient documentation

## 2018-05-12 DIAGNOSIS — Q231 Congenital insufficiency of aortic valve: Secondary | ICD-10-CM | POA: Insufficient documentation

## 2018-05-12 DIAGNOSIS — I77819 Aortic ectasia, unspecified site: Secondary | ICD-10-CM | POA: Insufficient documentation

## 2018-05-12 NOTE — Patient Instructions (Signed)

## 2018-06-26 ENCOUNTER — Other Ambulatory Visit: Payer: Self-pay | Admitting: Family Medicine

## 2018-06-26 DIAGNOSIS — K219 Gastro-esophageal reflux disease without esophagitis: Secondary | ICD-10-CM

## 2019-01-12 IMAGING — CT CT ANGIO CHEST
5 of 15 series · 13 of 37 positions shown · IV contrast (iopamidol)
Comparison: None.

CLINICAL DATA: Aortic valve disease.

EXAM:
CT ANGIOGRAPHY CHEST WITH CONTRAST
TECHNIQUE: Multidetector CT imaging of the chest was performed using the
standard protocol during bolus administration of intravenous
contrast. Multiplanar CT image reconstructions and MIPs were
obtained to evaluate the vascular anatomy.
CONTRAST:  100mL MGJRB3-SA7 IOPAMIDOL (MGJRB3-SA7) INJECTION 76%

[Series 6: best diast 72 % · axial · 0.35mm/px · z∈[+1102,+1176]mm · 3 of 372 slices shown]
[im 93/372  lung]
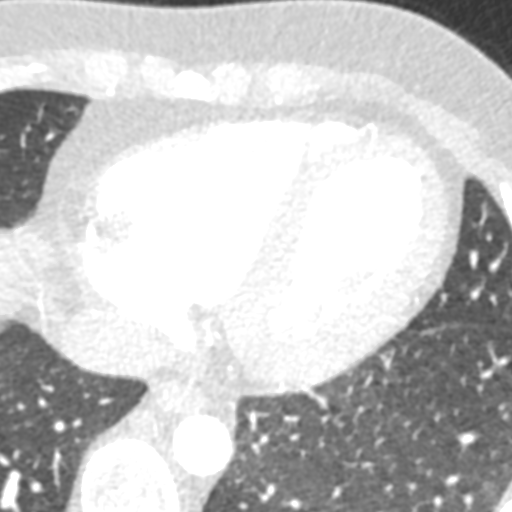
[im 186/372  mediastinal]
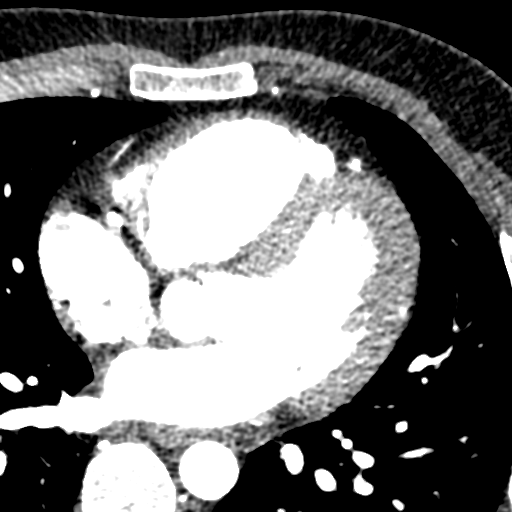
[im 279/372  lung]
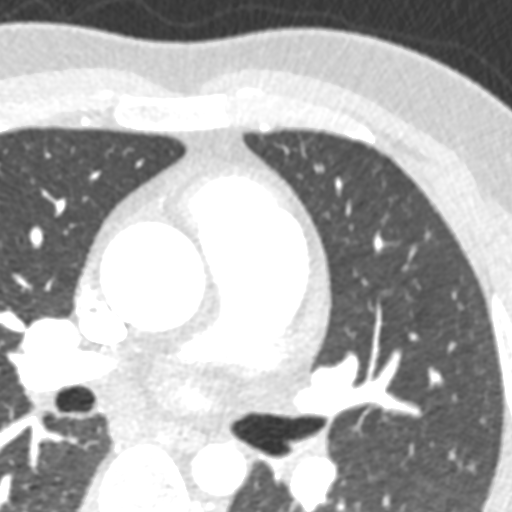

[Series 7: best syst 45 % · axial · 0.35mm/px · z∈[+1102,+1176]mm · 3 of 372 slices shown]
[im 93/372  lung]
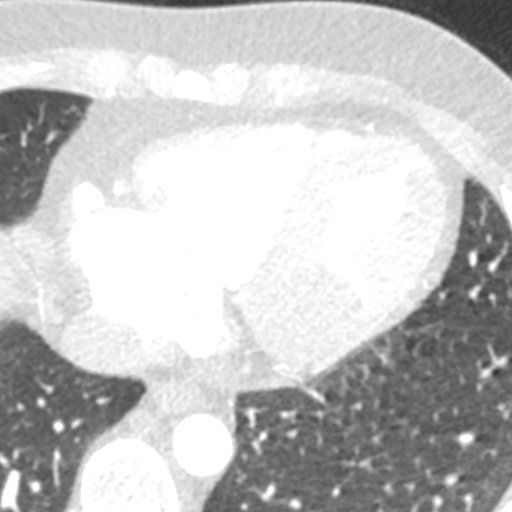
[im 186/372  lung]
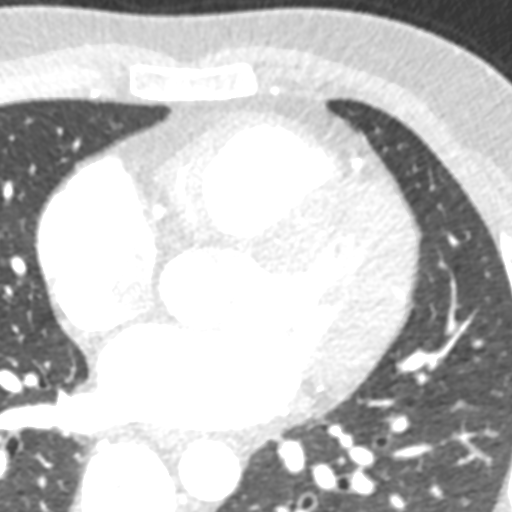
[im 279/372  lung]
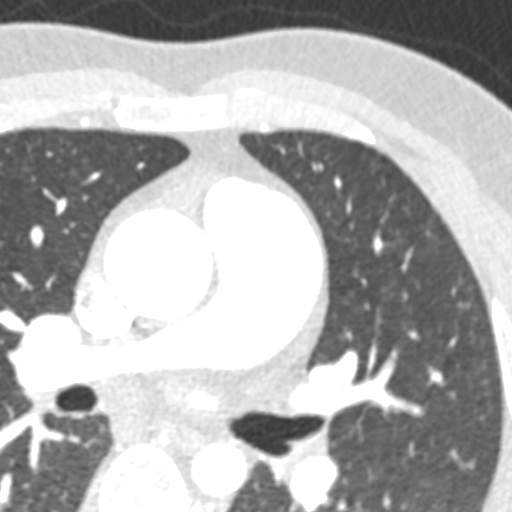

[Series 8: ts diast sharp 72 % · axial · 0.35mm/px · z∈[+1102,+1176]mm · 3 of 372 slices shown]
[im 93/372  mediastinal]
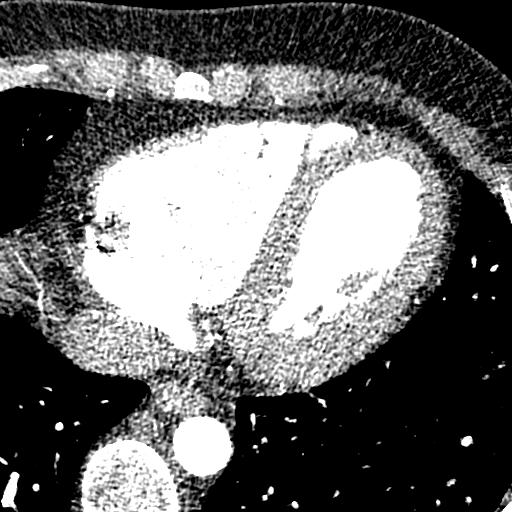
[im 186/372  mediastinal]
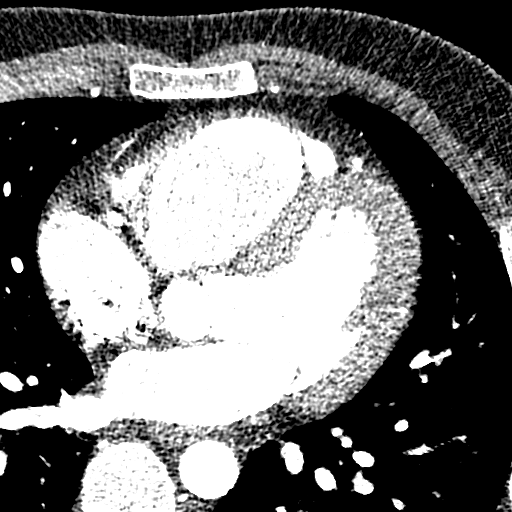
[im 279/372  mediastinal]
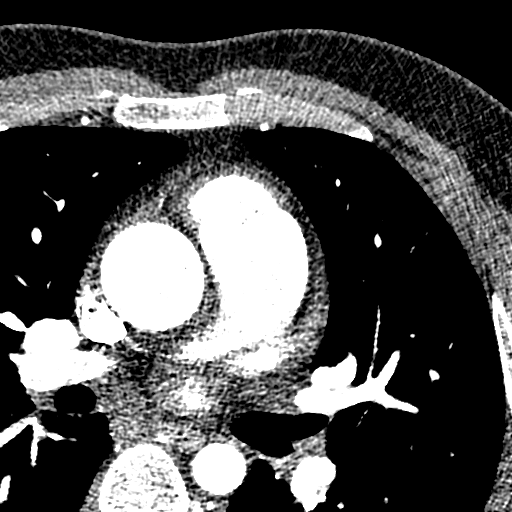

[Series 9: ts syst sharp 45 % · axial · 0.35mm/px · z∈[+1102,+1176]mm · 3 of 372 slices shown]
[im 93/372  mediastinal]
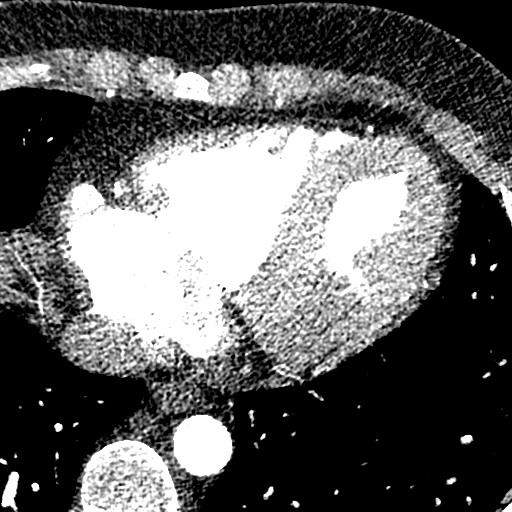
[im 186/372  mediastinal]
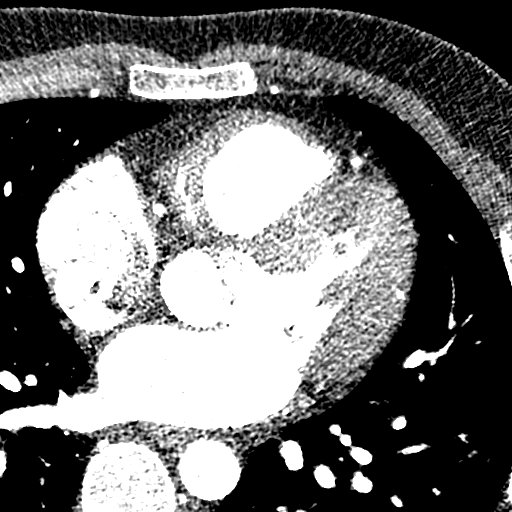
[im 279/372  mediastinal]
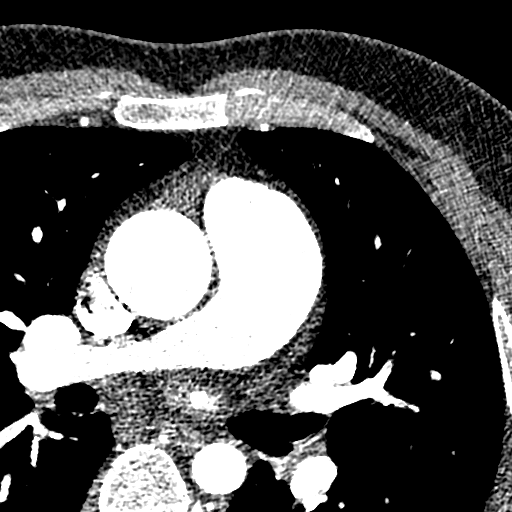

[Series 16: cor · coronal · 0.47mm/px · 1 of 124 slices shown]
[im 62/124  mediastinal]
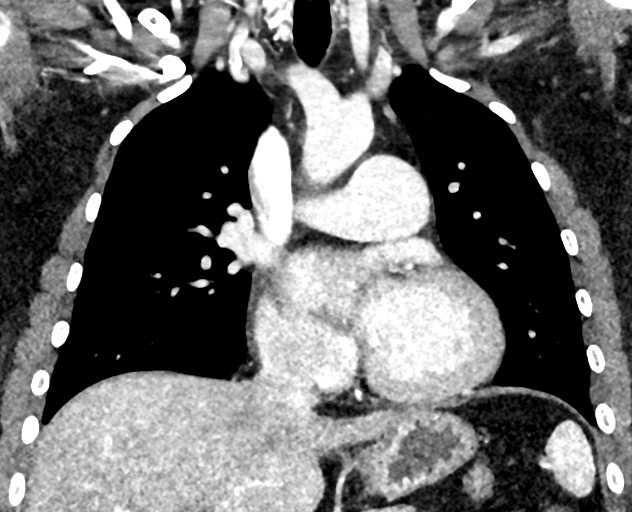

[13 of 37 positions shown; findings below may reference images not displayed]

FINDINGS: Cardiovascular: No filling defects in the pulmonary arteries to
suggest pulmonary emboli. Ascending aorta upper limits normal in
diameter at 3.8 cm maximally. Scattered aortic valve calcifications.
Heart is mildly enlarged.

Mediastinum/Nodes: No mediastinal, hilar, or axillary adenopathy.
Trachea and esophagus are unremarkable.

Lungs/Pleura: Visualized lungs clear.  No effusions.

Upper Abdomen: Imaging into the upper abdomen shows no acute
findings.

Musculoskeletal: Chest wall soft tissues are unremarkable. No acute
bony abnormality.

Review of the MIP images confirms the above findings.
IMPRESSION: Mild cardiomegaly. Ascending aorta upper limits normal in diameter
at 3.8 cm. Scattered aortic valve calcifications.

No acute cardiopulmonary disease.

## 2019-03-01 ENCOUNTER — Encounter: Payer: Self-pay | Admitting: Family Medicine

## 2019-03-02 NOTE — Telephone Encounter (Signed)
Pt is scheduled for an office visit on 03/02/2018 at 4 pm

## 2019-03-03 ENCOUNTER — Other Ambulatory Visit: Payer: Self-pay

## 2019-03-03 ENCOUNTER — Ambulatory Visit (INDEPENDENT_AMBULATORY_CARE_PROVIDER_SITE_OTHER): Payer: PRIVATE HEALTH INSURANCE | Admitting: Family Medicine

## 2019-03-03 ENCOUNTER — Encounter: Payer: Self-pay | Admitting: Family Medicine

## 2019-03-03 VITALS — BP 128/88 | HR 88 | Temp 97.9°F | Wt 206.0 lb

## 2019-03-03 DIAGNOSIS — K625 Hemorrhage of anus and rectum: Secondary | ICD-10-CM

## 2019-03-03 NOTE — Patient Instructions (Signed)
Lower Gastrointestinal Bleeding  Lower gastrointestinal (GI) bleeding is the result of bleeding from the colon, rectum, or anal area. The colon is the last part of the digestive tract, where stool, also called feces, is formed. If you have lower GI bleeding, you may see blood in or on your stool. It may be bright red. Lower GI bleeding often stops without treatment. Continued or heavy bleeding needs emergency treatment at the hospital. What are the causes? Lower GI bleeding may be caused by:  A condition that causes pouches to form in the colon over time (diverticulosis).  Swelling and irritation (inflammation) in areas with diverticulosis (diverticulitis).  Inflammation of the colon (inflammatory bowel disease).  Swollen veins in the rectum (hemorrhoids).  Painful tears in the anus (anal fissures), often caused by passing hard stools.  Cancer of the colon or rectum.  Noncancerous growths (polyps) of the colon or rectum.  A bleeding disorder that impairs the formation of blood clots and causes easy bleeding (coagulopathy).  An abnormal weakening of a blood vessel where an artery and a vein come together (arteriovenous malformation). What increases the risk? You are more likely to develop this condition if:  You are older than 29 years of age.  You take aspirin or NSAIDs on a regular basis.  You take anticoagulant or antiplatelet drugs.  You have a history of high-dose X-ray treatment (radiation therapy) of the colon.  You recently had a colon polyp removed. What are the signs or symptoms? Symptoms of this condition include:  Bright red blood or blood clots coming from your rectum.  Bloody stools.  Black or maroon-colored stools.  Pain or cramping in the abdomen.  Weakness or dizziness.  Racing heartbeat. How is this diagnosed? This condition may be diagnosed based on:  Your symptoms and medical history.  A physical exam. During the exam, your health care  provider will check for signs of blood loss, such as low blood pressure and a rapid pulse.  Tests, such as: ? Flexible sigmoidoscopy. In this procedure, a flexible tube with a camera on the end is used to examine your anus and the first part of your colon to look for the source of bleeding. ? Colonoscopy. This is similar to a flexible sigmoidoscopy, but the camera can extend all the way to the uppermost part of your colon. ? Blood tests to measure your red blood cell count and to check for coagulopathy. ? An imaging study of your colon to look for a bleeding site. In some cases, you may have X-rays taken after a dye or radioactive substance is injected into your bloodstream (angiogram). How is this treated? Treatment for this condition depends on the cause of the bleeding. Heavy or persistent bleeding is treated at the hospital. Treatment may include:  Getting fluids through an IV tube inserted into one of your veins.  Getting blood through an IV tube (blood transfusion).  Stopping bleeding through high-heat coagulation, injections of certain medicines, or applying surgical clips. This can all be done during a colonoscopy.  Having a procedure that involves first doing an angiogram and then blocking blood flow to the bleeding site (embolization).  Stopping some of your regular medicines for a certain amount of time.  Having surgery to remove part of the colon. This may be needed if bleeding is severe and does not respond to other treatment. Follow these instructions at home:  Take over-the-counter and prescription medicines only as told by your health care provider. You may need to   avoid aspirin, NSAIDs, or other medicines that increase bleeding.  Eat foods that are high in fiber. This will help keep your stools soft. These foods include whole grains, legumes, fruits, and vegetables. Eating 1-3 prunes each day works well for many people.  Drink enough fluid to keep your urine clear or pale  yellow.  Keep all follow-up visits as told by your health care provider. This is important. Contact a health care provider if:  Your symptoms do not improve. Get help right away if:  Your bleeding increases.  You feel light-headed or you faint.  You feel weak.  You have severe cramps in your back or abdomen.  You pass large blood clots in your stool.  Your symptoms get worse. This information is not intended to replace advice given to you by your health care provider. Make sure you discuss any questions you have with your health care provider. Document Revised: 04/30/2018 Document Reviewed: 05/24/2015 Elsevier Patient Education  2020 Elsevier Inc.  

## 2019-03-03 NOTE — Progress Notes (Signed)
Subjective:    Patient ID: Jimmy Russo, male    DOB: 02/09/1990, 29 y.o.   MRN: [id number]  No chief complaint on file.   HPI Patient was seen today for acute concern.  Pt notes BRBPR a few days ago.  Blood noted on outside of stool and on toilet paper.  Pt denies hemorrhoids, constipation, diarrhea.  Pt had similar issue in the past.  Past Medical History:  Diagnosis Date  . Bicuspid aortic valve     No Known Allergies  ROS General: Denies fever, chills, night sweats, changes in weight, changes in appetite HEENT: Denies headaches, ear pain, changes in vision, rhinorrhea, sore throat CV: Denies CP, palpitations, SOB, orthopnea Pulm: Denies SOB, cough, wheezing GI: Denies abdominal pain, nausea, vomiting, diarrhea, constipation  BRBPR GU: Denies dysuria, hematuria, frequency Msk: Denies muscle cramps, joint pains Neuro: Denies weakness, numbness, tingling Skin: Denies rashes, bruising Psych: Denies depression, anxiety, hallucinations      Objective:    Blood pressure 128/88, pulse 88, temperature 97.9 F (36.6 C), temperature source Temporal, weight 206 lb (93.4 kg), SpO2 96 %.  Gen. Pleasant, well-nourished, in no distress, normal affect   HEENT: [place]/AT, face symmetric, conjunctiva clear, no scleral icterus, PERRLA, nares patent without drainage Lungs: no accessory muscle use Cardiovascular: RRR, no m/r/g, no peripheral edema Abdomen: BS present, soft, NT/ND Neuro:  A&Ox3, CN II-XII intact, normal gait  Wt Readings from Last 3 Encounters:  03/03/19 206 lb (93.4 kg)  05/12/18 195 lb (88.5 kg)  12/22/17 206 lb (93.4 kg)    Lab Results  Component Value Date   WBC 5.7 03/27/2017   HGB 14.6 03/27/2017   HCT 42.8 03/27/2017   PLT 243.0 03/27/2017   GLUCOSE 88 03/27/2017   CHOL 173 10/16/2017   TRIG 115.0 10/16/2017   HDL 34.10 (L) 10/16/2017   LDLCALC 116 (H) 10/16/2017   ALT 13 03/27/2017   AST 13 03/27/2017   NA 138 03/27/2017   K 4.1 03/27/2017   CL  101 03/27/2017   CREATININE 0.87 03/27/2017   BUN 14 03/27/2017   CO2 30 03/27/2017   HGBA1C 5.2 10/16/2017    Assessment/Plan:  BRBPR (bright red blood per rectum)  -constipation contributory in the past -discussed increasing po intake of fiber and water. - Plan: Ambulatory referral to Gastroenterology  F/u prn  Abbe Amsterdam, MD

## 2019-03-04 ENCOUNTER — Encounter: Payer: Self-pay | Admitting: Gastroenterology

## 2019-03-05 ENCOUNTER — Encounter: Payer: Self-pay | Admitting: Family Medicine

## 2019-03-29 ENCOUNTER — Ambulatory Visit: Payer: PRIVATE HEALTH INSURANCE | Admitting: Gastroenterology

## 2019-03-30 ENCOUNTER — Telehealth: Payer: PRIVATE HEALTH INSURANCE

## 2019-04-01 ENCOUNTER — Encounter: Payer: Self-pay | Admitting: Family Medicine

## 2019-04-04 ENCOUNTER — Ambulatory Visit: Payer: PRIVATE HEALTH INSURANCE | Admitting: Gastroenterology

## 2019-06-07 ENCOUNTER — Encounter: Payer: Self-pay | Admitting: General Practice

## 2019-07-14 ENCOUNTER — Other Ambulatory Visit: Payer: Self-pay

## 2019-07-14 DIAGNOSIS — I77810 Thoracic aortic ectasia: Secondary | ICD-10-CM | POA: Insufficient documentation

## 2019-07-14 NOTE — Progress Notes (Signed)
Cardiology Office Note   Date:  07/15/2019   ID:  Jimmy Russo, DOB 02-Dec-1990, MRN [id number]  PCP:  Billie Ruddy, MD  Cardiologist:   Minus Breeding, MD  Chief Complaint  Patient presents with  . Bicuspid Aortic Valve      History of Present Illness: Jimmy Russo is a 29 y.o. male who is a 29 y.o. male who presents for evaluation of bicuspid aortic valve.  The patient says he is known about this since he was born. He was in [place].  He had an echo in 2018 with a bicuspid valve and mild AS.  He had mild aortic dilatation of the aortic root on CT.  There was no evidence of coarctation.    Since I last saw him he has done well.  His heart rate is a little high today which she has noted may be recently but he is not really been feeling any palpitations, presyncope or syncope.  He is not been exercising during the pandemic.  He is however not having any cardiovascular symptoms. The patient denies any new symptoms such as chest discomfort, neck or arm discomfort. There has been no new shortness of breath, PND or orthopnea. There have been no reported palpitations, presyncope or syncope.   Past Medical History:  Diagnosis Date  . Bicuspid aortic valve     Past Surgical History:  Procedure Laterality Date  . HYDROCELE EXCISION       No current outpatient medications on file.   No current facility-administered medications for this visit.    Allergies:   Patient has no known allergies.    ROS:  Please see the history of present illness.   Otherwise, review of systems are positive for none.   All other systems are reviewed and negative.    PHYSICAL EXAM: VS:  BP 124/80   Pulse 100   Temp (!) 96.3 F (35.7 C)   Ht 5\' 9"  (1.753 m)   Wt 204 lb (92.5 kg)   SpO2 97%   BMI 30.13 kg/m  , BMI Body mass index is 30.13 kg/m. GENERAL:  Well appearing NECK:  No jugular venous distention, waveform within normal limits, carotid upstroke brisk and  symmetric, no bruits, no thyromegaly LUNGS:  Clear to auscultation bilaterally CHEST:  Unremarkable HEART:  PMI not displaced or sustained,S1 and S2 within normal limits, no S3, no S4, positive opening snap, no rubs, soft brief apical systolic murmur nonradiating murmurs ABD:  Flat, positive bowel sounds normal in frequency in pitch, no bruits, no rebound, no guarding, no midline pulsatile mass, no hepatomegaly, no splenomegaly EXT:  2 plus pulses throughout, no edema, no cyanosis no clubbing    EKG:  EKG is ordered today. The ekg ordered today demonstrates sinus tachycardia, rate 100, borderline interventricular conduction delay, borderline right axis deviation, no acute ST-T wave changes.   Recent Labs: No results found for requested labs within last 8760 hours.    Lipid Panel    Component Value Date/Time   CHOL 173 10/16/2017 0932   TRIG 115.0 10/16/2017 0932   HDL 34.10 (L) 10/16/2017 0932   CHOLHDL 5 10/16/2017 0932   VLDL 23.0 10/16/2017 0932   LDLCALC 116 (H) 10/16/2017 0932      Wt Readings from Last 3 Encounters:  07/15/19 204 lb (92.5 kg)  03/03/19 206 lb (93.4 kg)  05/12/18 195 lb (88.5 kg)      Other studies Reviewed: Additional studies/ records that were reviewed today  include: None. Review of the above records demonstrates:  Please see elsewhere in the note.     ASSESSMENT AND PLAN:  BICUSPID AORTIC VALVE:    I am going to check an echocardiogram today.  This will also give me a review of his ascending aorta which is mildly enlarged.  However, I do not suspect further testing will be needed and I will be able to follow this clinically for a number of years.  TACHYCARDIA: I am going to have a TSH checked and a CBC.  He has an appointment in a few minutes with his primary care and of asked them to add this to blood work to be expected to be drawn.  However, I think this is might be a touch of anxiety from being in the office and a little bit of deconditioning.   We talked at length about improving vagal tone with exercise and that he would let me know if he has any increased heart rate beyond that.  I do think he should get a Fitbit to monitor his heart rate as well.  AORTIC ROOT DILATATION:     This will be assessed as above.  COVID-19 EDUCATION:   He has had his vaccine.   Current medicines are reviewed at length with the patient today.  The patient does not have concerns regarding medicines.  The following changes have been made:  no change  Labs/ tests ordered today include:   Orders Placed This Encounter  Procedures  . EKG 12-Lead  . ECHOCARDIOGRAM COMPLETE     Disposition:   FU with me in one year.     Signed, Rollene Rotunda, MD  07/15/2019 3:24 PM    [facility]

## 2019-07-15 ENCOUNTER — Encounter: Payer: 59 | Admitting: Family Medicine

## 2019-07-15 ENCOUNTER — Ambulatory Visit (INDEPENDENT_AMBULATORY_CARE_PROVIDER_SITE_OTHER): Payer: 59 | Admitting: Cardiology

## 2019-07-15 ENCOUNTER — Encounter: Payer: Self-pay | Admitting: Cardiology

## 2019-07-15 VITALS — BP 124/80 | HR 100 | Temp 96.3°F | Ht 69.0 in | Wt 204.0 lb

## 2019-07-15 DIAGNOSIS — Z7189 Other specified counseling: Secondary | ICD-10-CM | POA: Diagnosis not present

## 2019-07-15 DIAGNOSIS — I77810 Thoracic aortic ectasia: Secondary | ICD-10-CM | POA: Diagnosis not present

## 2019-07-15 DIAGNOSIS — Q231 Congenital insufficiency of aortic valve: Secondary | ICD-10-CM | POA: Diagnosis not present

## 2019-07-15 NOTE — Patient Instructions (Signed)
Medication Instructions:  Your physician recommends that you continue on your current medications as directed. Please refer to the Current Medication list given to you today.  *If you need a refill on your cardiac medications before your next appointment, please call your pharmacy*   Lab Work: none If you have labs (blood work) drawn today and your tests are completely normal, you will receive your results only by: Marland Kitchen MyChart Message (if you have MyChart) OR . A paper copy in the mail If you have any lab test that is abnormal or we need to change your treatment, we will call you to review the results.   Testing/Procedures: Your physician has requested that you have an echocardiogram. Echocardiography is a painless test that uses sound waves to create images of your heart. It provides your doctor with information about the size and shape of your heart and how well your heart's chambers and valves are working. This procedure takes approximately one hour. There are no restrictions for this procedure. Call the office to schedule [phone]   Follow-Up: At [facility], you and your health needs are our priority.  As part of our continuing mission to provide you with exceptional heart care, we have created designated Provider Care Teams.  These Care Teams include your primary Cardiologist (physician) and Advanced Practice Providers (APPs -  Physician Assistants and Nurse Practitioners) who all work together to provide you with the care you need, when you need it.  We recommend signing up for the patient portal called "MyChart".  Sign up information is provided on this After Visit Summary.  MyChart is used to connect with patients for Virtual Visits (Telemedicine).  Patients are able to view lab/test results, encounter notes, upcoming appointments, etc.  Non-urgent messages can be sent to your provider as well.   To learn more about what you can do with MyChart, go to [email or web address].     Your next appointment:   12 month(s)  You will receive a reminder letter in the mail two months in advance. If you don't receive a letter, please call our office to schedule the follow-up appointment.   The format for your next appointment:   In Person  Provider:   You may see Rollene Rotunda, MD or one of the following Advanced Practice Providers on your designated Care Team:    Theodore Demark, PA-C  Joni Reining, DNP, ANP  Cadence Fransico Michael, NP

## 2019-07-18 ENCOUNTER — Encounter: Payer: PRIVATE HEALTH INSURANCE | Admitting: Family Medicine

## 2019-07-22 ENCOUNTER — Ambulatory Visit (INDEPENDENT_AMBULATORY_CARE_PROVIDER_SITE_OTHER): Payer: 59 | Admitting: Family Medicine

## 2019-07-22 ENCOUNTER — Other Ambulatory Visit: Payer: Self-pay

## 2019-07-22 ENCOUNTER — Encounter: Payer: Self-pay | Admitting: Family Medicine

## 2019-07-22 VITALS — BP 128/78 | HR 108 | Temp 98.1°F | Ht 69.0 in | Wt 208.2 lb

## 2019-07-22 DIAGNOSIS — M25531 Pain in right wrist: Secondary | ICD-10-CM

## 2019-07-22 DIAGNOSIS — Z0000 Encounter for general adult medical examination without abnormal findings: Secondary | ICD-10-CM | POA: Diagnosis not present

## 2019-07-22 DIAGNOSIS — R000 Tachycardia, unspecified: Secondary | ICD-10-CM

## 2019-07-22 LAB — T4, FREE: Free T4: 1.5 ng/dL (ref 0.8–1.8)

## 2019-07-22 LAB — CBC WITH DIFFERENTIAL/PLATELET
Absolute Monocytes: 498 cells/uL (ref 200–950)
Basophils Absolute: 67 cells/uL (ref 0–200)
Basophils Relative: 1.2 %
Eosinophils Absolute: 50 cells/uL (ref 15–500)
Eosinophils Relative: 0.9 %
HCT: 43.8 % (ref 38.5–50.0)
Hemoglobin: 15.1 g/dL (ref 13.2–17.1)
Lymphs Abs: 1747 cells/uL (ref 850–3900)
MCH: 29.9 pg (ref 27.0–33.0)
MCHC: 34.5 g/dL (ref 32.0–36.0)
MCV: 86.7 fL (ref 80.0–100.0)
MPV: 10.9 fL (ref 7.5–12.5)
Monocytes Relative: 8.9 %
Neutro Abs: 3237 cells/uL (ref 1500–7800)
Neutrophils Relative %: 57.8 %
Platelets: 240 10*3/uL (ref 140–400)
RBC: 5.05 10*6/uL (ref 4.20–5.80)
RDW: 12.5 % (ref 11.0–15.0)
Total Lymphocyte: 31.2 %
WBC: 5.6 10*3/uL (ref 3.8–10.8)

## 2019-07-22 LAB — BASIC METABOLIC PANEL
BUN: 12 mg/dL (ref 7–25)
CO2: 31 mmol/L (ref 20–32)
Calcium: 9.5 mg/dL (ref 8.6–10.3)
Chloride: 103 mmol/L (ref 98–110)
Creat: 0.95 mg/dL (ref 0.60–1.35)
Glucose, Bld: 84 mg/dL (ref 65–99)
Potassium: 4.2 mmol/L (ref 3.5–5.3)
Sodium: 139 mmol/L (ref 135–146)

## 2019-07-22 LAB — TSH: TSH: 1.5 mIU/L (ref 0.40–4.50)

## 2019-07-22 NOTE — Patient Instructions (Addendum)
Preventive Care 19-29 Years Old, Male Preventive care refers to lifestyle choices and visits with your health care provider that can promote health and wellness. This includes:  A yearly physical exam. This is also called an annual well check.  Regular dental and eye exams.  Immunizations.  Screening for certain conditions.  Healthy lifestyle choices, such as eating a healthy diet, getting regular exercise, not using drugs or products that contain nicotine and tobacco, and limiting alcohol use. What can I expect for my preventive care visit? Physical exam Your health care provider will check:  Height and weight. These may be used to calculate body mass index (BMI), which is a measurement that tells if you are at a healthy weight.  Heart rate and blood pressure.  Your skin for abnormal spots. Counseling Your health care provider may ask you questions about:  Alcohol, tobacco, and drug use.  Emotional well-being.  Home and relationship well-being.  Sexual activity.  Eating habits.  Work and work Statistician. What immunizations do I need?  Influenza (flu) vaccine  This is recommended every year. Tetanus, diphtheria, and pertussis (Tdap) vaccine  You may need a Td booster every 10 years. Varicella (chickenpox) vaccine  You may need this vaccine if you have not already been vaccinated. Human papillomavirus (HPV) vaccine  If recommended by your health care provider, you may need three doses over 6 months. Measles, mumps, and rubella (MMR) vaccine  You may need at least one dose of MMR. You may also need a second dose. Meningococcal conjugate (MenACWY) vaccine  One dose is recommended if you are 45-76 years old and a Market researcher living in a residence hall, or if you have one of several medical conditions. You may also need additional booster doses. Pneumococcal conjugate (PCV13) vaccine  You may need this if you have certain conditions and were not  previously vaccinated. Pneumococcal polysaccharide (PPSV23) vaccine  You may need one or two doses if you smoke cigarettes or if you have certain conditions. Hepatitis A vaccine  You may need this if you have certain conditions or if you travel or work in places where you may be exposed to hepatitis A. Hepatitis B vaccine  You may need this if you have certain conditions or if you travel or work in places where you may be exposed to hepatitis B. Haemophilus influenzae type b (Hib) vaccine  You may need this if you have certain risk factors. You may receive vaccines as individual doses or as more than one vaccine together in one shot (combination vaccines). Talk with your health care provider about the risks and benefits of combination vaccines. What tests do I need? Blood tests  Lipid and cholesterol levels. These may be checked every 5 years starting at age 17.  Hepatitis C test.  Hepatitis B test. Screening   Diabetes screening. This is done by checking your blood sugar (glucose) after you have not eaten for a while (fasting).  Sexually transmitted disease (STD) testing. Talk with your health care provider about your test results, treatment options, and if necessary, the need for more tests. Follow these instructions at home: Eating and drinking   Eat a diet that includes fresh fruits and vegetables, whole grains, lean protein, and low-fat dairy products.  Take vitamin and mineral supplements as recommended by your health care provider.  Do not drink alcohol if your health care provider tells you not to drink.  If you drink alcohol: ? Limit how much you have to 0-2  drinks a day. ? Be aware of how much alcohol is in your drink. In the [place] one drink equals one 12 oz bottle of beer (355 mL), one 5 oz glass of wine (148 mL), or one 1 oz glass of hard liquor (44 mL). Lifestyle  Take daily care of your teeth and gums.  Stay active. Exercise for at least 30 minutes on 5 or  more days each week.  Do not use any products that contain nicotine or tobacco, such as cigarettes, e-cigarettes, and chewing tobacco. If you need help quitting, ask your health care provider.  If you are sexually active, practice safe sex. Use a condom or other form of protection to prevent STIs (sexually transmitted infections). What's next?  Go to your health care provider once a year for a well check visit.  Ask your health care provider how often you should have your eyes and teeth checked.  Stay up to date on all vaccines. This information is not intended to replace advice given to you by your health care provider. Make sure you discuss any questions you have with your health care provider. Document Revised: 12/31/2017 Document Reviewed: 12/31/2017 Elsevier Patient Education  [place].  Wrist Pain, Adult There are many things that can cause wrist pain. Some common causes include:  An injury to the wrist area, such as a sprain, strain, or fracture.  Overuse of the joint.  A condition that causes increased pressure on a nerve in the wrist (carpal tunnel syndrome).  Wear and tear of the joints that occurs with aging (osteoarthritis).  A variety of other types of arthritis. Sometimes, the cause of wrist pain is not known. Often, the pain goes away when you follow instructions from your health care provider for relieving pain at home, such as resting or icing the wrist. If your wrist pain continues, it is important to tell your health care provider. Follow these instructions at home:  Rest the wrist area for at least 48 hours or as long as told by your health care provider.  If a splint or elastic bandage has been applied, use it as told by your health care provider. ? Remove the splint or bandage only as told by your health care provider. ? Loosen the splint or bandage if your fingers tingle, become numb, or turn cold or blue.  If directed, apply ice to the injured  area. ? If you have a removable splint or elastic bandage, remove it as told by your health care provider. ? Put ice in a plastic bag. ? Place a towel between your skin and the bag or between your splint or bandage and the bag. ? Leave the ice on for 20 minutes, 2-3 times a day.   Keep your arm raised (elevated) above the level of your heart while you are sitting or lying down.  Take over-the-counter and prescription medicines only as told by your health care provider.  Keep all follow-up visits as told by your health care provider. This is important. Contact a health care provider if:  You have a sudden sharp pain in the wrist, hand, or arm that is different or new.  The swelling or bruising on your wrist or hand gets worse.  Your skin becomes red, gets a rash, or has open sores.  Your pain does not get better or it gets worse. Get help right away if:  You lose feeling in your fingers or hand.  Your fingers turn white, very red, or  cold and blue.  You cannot move your fingers.  You have a fever or chills. This information is not intended to replace advice given to you by your health care provider. Make sure you discuss any questions you have with your health care provider. Document Revised: 12/19/2016 Document Reviewed: 07/26/2015 Elsevier Patient Education  [place].  Sinus Tachycardia  Sinus tachycardia is a kind of fast heartbeat. In sinus tachycardia, the heart beats more than 100 times a minute. Sinus tachycardia starts in a part of the heart called the sinus node. Sinus tachycardia may be harmless, or it may be a sign of a serious condition. What are the causes? This condition may be caused by:  Exercise or exertion.  A fever.  Pain.  Loss of body fluids (dehydration).  Severe bleeding (hemorrhage).  Anxiety and stress.  Certain substances, including: ? Alcohol. ? Caffeine. ? Tobacco and nicotine products. ? Cold medicines. ? Illegal  drugs.  Medical conditions including: ? Heart disease. ? An infection. ? An overactive thyroid (hyperthyroidism). ? A lack of red blood cells (anemia). What are the signs or symptoms? Symptoms of this condition include:  A feeling that the heart is beating quickly (palpitations).  Suddenly noticing your heartbeat (cardiac awareness).  Dizziness.  Tiredness (fatigue).  Shortness of breath.  Chest pain.  Nausea.  Fainting. How is this diagnosed? This condition is diagnosed with:  A physical exam.  Other tests, such as: ? Blood tests. ? An electrocardiogram (ECG). This test measures the electrical activity of the heart. ? Ambulatory cardiac monitor. This records your heartbeats for 24 hours or more. You may be referred to a heart specialist (cardiologist). How is this treated? Treatment for this condition depends on the cause or the underlying condition. Treatment may involve:  Treating the underlying condition.  Taking new medicines or changing your current medicines as told by your health care provider.  Making changes to your diet or lifestyle. Follow these instructions at home: Lifestyle   Do not use any products that contain nicotine or tobacco, such as cigarettes and e-cigarettes. If you need help quitting, ask your health care provider.  Do not use illegal drugs, such as cocaine.  Learn relaxation methods to help you when you get stressed or anxious. These include deep breathing.  Avoid caffeine or other stimulants. Alcohol use   Do not drink alcohol if: ? Your health care provider tells you not to drink. ? You are pregnant, may be pregnant, or are planning to become pregnant.  If you drink alcohol, limit how much you have: ? 0-1 drink a day for women. ? 0-2 drinks a day for men.  Be aware of how much alcohol is in your drink. In the [place] one drink equals one typical bottle of beer (12 oz), one-half glass of wine (5 oz), or one shot of hard liquor  (1 oz). General instructions  Drink enough fluids to keep your urine pale yellow.  Take over-the-counter and prescription medicines only as told by your health care provider.  Keep all follow-up visits as told by your health care provider. This is important. Contact a health care provider if you have:  A fever.  Vomiting or diarrhea that does not go away. Get help right away if you:  Have pain in your chest, upper arms, jaw, or neck.  Become weak or dizzy.  Feel faint.  Have palpitations that do not go away. Summary  In sinus tachycardia, the heart beats more than 100 times  a minute.  Sinus tachycardia may be harmless, or it may be a sign of a serious condition.  Treatment for this condition depends on the cause or the underlying condition.  Get help right away if you have pain in your chest, upper arms, jaw, or neck. This information is not intended to replace advice given to you by your health care provider. Make sure you discuss any questions you have with your health care provider. Document Revised: 02/25/2017 Document Reviewed: 02/25/2017 Elsevier Patient Education  [place].  Wrist and Forearm Exercises Ask your health care provider which exercises are safe for you. Do exercises exactly as told by your health care provider and adjust them as directed. It is normal to feel mild stretching, pulling, tightness, or discomfort as you do these exercises. Stop right away if you feel sudden pain or your pain gets worse. Do not begin these exercises until told by your health care provider. Range-of-motion exercises These exercises warm up your muscles and joints and improve the movement and flexibility of your injured wrist and forearm. These exercises also help to relieve pain, numbness, and tingling. These exercises are done using the muscles in your injured wrist and forearm. Wrist flexion 1. Bend your left / right elbow to a 90-degree angle (right angle) with your  palm facing the floor. 2. Bend your wrist so that your fingers point toward the floor (flexion). 3. Hold this position for __________ seconds. 4. Slowly return to the starting position. Repeat __________ times. Complete this exercise __________ times a day. Wrist extension 1. Bend your left / right elbow to a 90-degree angle (right angle) with your palm facing the floor. 2. Bend your wrist so that your fingers point toward the ceiling (extension). 3. Hold this position for __________ seconds. 4. Slowly return to the starting position. Repeat __________ times. Complete this exercise __________ times a day. Ulnar deviation 1. Bend your left / right elbow to a 90-degree angle (right angle), and rest your forearm on a table with your palm facing down. 2. Keeping your hand flat on the table, bend your left /right wrist toward your small finger (pinkie). This is ulnar deviation. 3. Hold this position for __________ seconds. 4. Slowly return to the starting position. Repeat __________ times. Complete this exercise __________ times a day. Radial deviation 1. Bend your left / right elbow to a 90-degree angle (right angle), and rest your forearm on a table with your palm facing down. 2. Keeping your hand flat on the table, bend your left /right wrist toward your thumb. This is radial deviation. 3. Hold this position for __________ seconds. 4. Slowly return to the starting position. Repeat __________ times. Complete this exercise __________ times a day. Forearm rotation, supination  1. Sit with your left / right elbow bent to a 90-degree angle (right angle). Position your forearm so that the thumb is facing the ceiling (neutral position). 2. Turn (rotate) your palm up toward the ceiling (supination), stopping when you feel a gentle stretch. 3. Hold this position for __________ seconds. 4. Slowly return to the starting position. Repeat __________ times. Complete this exercise __________ times a  day. Forearm rotation, pronation  1. Sit with your left / right elbow bent to a 90-degree angle (right angle). Position your forearm so that the thumb is facing the ceiling (neutral position). 2. Rotate your palm down toward the floor (pronation), stopping when you feel a gentle stretch. 3. Hold this position for __________ seconds. 4. Slowly return  to the starting position. Repeat __________ times. Complete this exercise __________ times a day. Stretching These exercises warm up your muscles and joints and improve the movement and flexibility of your injured wrist and forearm. These exercises also help to relieve pain, numbness, and tingling. These exercises are done using your healthy wrist and forearm to help stretch the muscles in your injured wrist and forearm. Wrist flexion  1. Extend your left / right arm in front of you, and turn your palm down toward the floor. ? If told by your health care provider, bend your left / right elbow to a 90-degree angle (right angle) at your side. 2. Using your uninjured hand, gently press over the back of your left / right hand to bend your wrist and fingers toward the floor (flexion). Go as far as you can to feel a stretch without causing pain. 3. Hold this position for __________ seconds. 4. Slowly return to the starting position. Repeat __________ times. Complete this exercise __________ times a day. Wrist extension  1. Extend your left / right arm in front of you and turn your palm up toward the ceiling. ? If told by your health care provider, bend your left / right elbow to a 90-degree angle (right angle) at your side. 2. Using your uninjured hand, gently press over the palm of your left / right hand to bend your wrist and fingers toward the floor (extension). Go as far as you can to feel a stretch without causing pain. 3. Hold this position for __________ seconds. 4. Slowly return to the starting position. Repeat __________ times. Complete this  exercise __________ times a day. Forearm rotation, supination 1. Sit with your left / right elbow bent to a 90-degree angle (right angle). Position your forearm so that the thumb is facing the ceiling (neutral position). 2. Rotate your palm up toward the ceiling as far as you can on your own (supination). Then, use your uninjured hand to help turn your forearm more, stopping when you feel a gentle stretch. 3. Hold this position for __________ seconds. 4. Slowly return to the starting position. Repeat __________ times. Complete this exercise __________ times a day. Forearm rotation, pronation 1. Sit with your left / right elbow bent to a 90-degree angle (right angle). Position your forearm so that the thumb is facing the ceiling (neutral position). 2. Rotate your palm down toward the floor as far as you can on your own (pronation). Then, use your uninjured hand to help turn your forearm more, stopping when you feel a gentle stretch. 3. Hold this position for __________ seconds. 4. Slowly return to the starting position. Repeat __________ times. Complete this exercise __________ times a day. Strengthening exercises These exercises build strength and endurance in your wrist and forearm. Endurance is the ability to use your muscles for a long time, even after they get tired. Wrist flexion  1. Sit with your left / right forearm supported on a table or other surface. Bend your elbow to a 90-degree angle (right angle), and rest your hand palm-up over the edge of the table. 2. Hold a __________ weight in your left / right hand. Or, hold an exercise band or tube in both hands, keeping your hands at the same level and hip distance apart. There should be a slight tension in the exercise band or tube. 3. Slowly curl your hand up toward the ceiling (flexion). 4. Hold this position for __________ seconds. 5. Slowly lower your hand back to the  starting position. Repeat __________ times. Complete this exercise  __________ times a day. Wrist extension  1. Sit with your left / right forearm supported on a table or other surface. Bend your elbow to a 90-degree angle (right angle), and rest your hand palm-down over the edge of the table. 2. Hold a __________ weight in your left / right hand. Or, hold an exercise band or tube in both hands, keeping your hands at the same level and hip distance apart. There should be a slight tension in the exercise band or tube. 3. Slowly curl your hand up toward the ceiling (extension). 4. Hold this position for __________ seconds. 5. Slowly lower your hand back to the starting position. Repeat __________ times. Complete this exercise __________ times a day. Forearm rotation, supination  1. Sit with your left / right forearm supported on a table or other surface. Bend your elbow to a 90-degree angle (right angle). Position your forearm so that your thumb is facing the ceiling (neutral position) and your hand is resting over the edge of the table. 2. Hold a hammer in your left / right hand. ? This exercise will be easier if you hold the hammer near the head of the hammer. ? This exercise will be harder if you hold the hammer near the end of the handle. 3. Without moving your elbow, slowly rotate your palm up toward the ceiling (supination). 4. Hold this position for __________ seconds. 5. Slowly return to the starting position. Repeat __________ times. Complete this exercise __________ times a day. Forearm rotation, pronation  1. Sit with your left / right forearm supported on a table or other surface. Bend your elbow to a 90-degree angle (right angle). Position your forearm so that the thumb is facing the ceiling (neutral position), with your hand resting over the edge of the table. 2. Hold a hammer in your left / right hand. ? This exercise will be easier if you hold the hammer near the head of the hammer. ? This exercise will be harder if you hold the hammer near the  end of the handle. 3. Without moving your elbow, slowly rotate your palm down toward the floor (pronation). 4. Hold this position for __________ seconds. 5. Slowly return to the starting position. Repeat __________ times. Complete this exercise __________ times a day. Grip strengthening  1. Grasp a stress ball or other ball in the middle of your left / right hand. Start with your elbow bent to a 90-degree angle (right angle). 2. Slowly increase the pressure, squeezing the ball as hard as you can without causing pain. ? Think of bringing the tips of your fingers into the middle of your palm. All of your finger joints should bend when doing this exercise. ? To make this exercise harder, gradually try to straighten your elbow in front of you, until you can do the exercise with your elbow fully straight. 3. Hold your squeeze for __________ seconds, then relax. If instructed by your health care provider, do this exercise: ? With your forearm positioned so that the thumb is facing the ceiling (neutral position). ? With your forearm turned palm down. ? With your forearm turned palm up. Repeat __________ times. Complete this exercise __________ times a day. This information is not intended to replace advice given to you by your health care provider. Make sure you discuss any questions you have with your health care provider. Document Revised: 02/25/2018 Document Reviewed: 02/25/2018 Elsevier Patient Education  [place].

## 2019-07-29 ENCOUNTER — Telehealth (HOSPITAL_COMMUNITY): Payer: Self-pay | Admitting: Cardiology

## 2019-07-29 NOTE — Progress Notes (Signed)
Subjective:     Jimmy Russo is a 29 y.o. male and is here for a comprehensive physical exam. The patient reports problems - wrist pain. Pt notes R wrist pain x a few wks.  Noted with certain movements, denies edema.  Endorses doing typing at work.  Pt saw Cardiology for tachycardia and h/o bicuspid aortic valve.   Social History   Socioeconomic History  . Marital status: Single    Spouse name: Not on file  . Number of children: Not on file  . Years of education: Not on file  . Highest education level: Not on file  Occupational History  . Not on file  Tobacco Use  . Smoking status: Former Games developer  . Smokeless tobacco: Former Neurosurgeon    Types: Engineer, drilling  . Vaping Use: Former  Substance and Sexual Activity  . Alcohol use: Yes    Comment: ocass  . Drug use: No  . Sexual activity: Not on file  Other Topics Concern  . Not on file  Social History Narrative  . Not on file   Social Determinants of Health   Financial Resource Strain:   . Difficulty of Paying Living Expenses:   Food Insecurity:   . Worried About Programme researcher, broadcasting/film/video in the Last Year:   . Barista in the Last Year:   Transportation Needs:   . Freight forwarder (Medical):   Marland Kitchen Lack of Transportation (Non-Medical):   Physical Activity:   . Days of Exercise per Week:   . Minutes of Exercise per Session:   Stress:   . Feeling of Stress :   Social Connections:   . Frequency of Communication with Friends and Family:   . Frequency of Social Gatherings with Friends and Family:   . Attends Religious Services:   . Active Member of Clubs or Organizations:   . Attends Banker Meetings:   Marland Kitchen Marital Status:   Intimate Partner Violence:   . Fear of Current or Ex-Partner:   . Emotionally Abused:   Marland Kitchen Physically Abused:   . Sexually Abused:    Health Maintenance  Topic Date Due  . Hepatitis C Screening  Never done  . COVID-19 Vaccine (1) Never done  . HIV Screening  Never done  .  TETANUS/TDAP  Never done  . INFLUENZA VACCINE  08/21/2019    The following portions of the patient's history were reviewed and updated as appropriate: allergies, current medications, past family history, past medical history, past social history, past surgical history and problem list.  Review of Systems Pertinent items noted in HPI and remainder of comprehensive ROS otherwise negative.   Objective:    BP 128/78 (BP Location: Left Arm, Patient Position: Sitting, Cuff Size: Normal)   Pulse (!) 108   Temp 98.1 F (36.7 C) (Temporal)   Ht 5\' 9"  (1.753 m)   Wt 208 lb 3.2 oz (94.4 kg)   SpO2 97%   BMI 30.75 kg/m  General appearance: alert, cooperative and no distress Head: Normocephalic, without obvious abnormality, atraumatic Eyes: conjunctivae/corneas clear. PERRL, EOM's intact. Fundi benign. Ears: normal TM's and external ear canals both ears Nose: Nares normal. Septum midline. Mucosa normal. No drainage or sinus tenderness. Throat: lips, mucosa, and tongue normal; teeth and gums normal Neck: no adenopathy, no carotid bruit, no JVD, supple, symmetrical, trachea midline and thyroid not enlarged, symmetric, no tenderness/mass/nodules Lungs: clear to auscultation bilaterally Heart: regular rate and rhythm, S1, S2 normal, no murmur,  click, rub or gallop Abdomen: soft, non-tender; bowel sounds normal; no masses,  no organomegaly Extremities: extremities normal, atraumatic, no cyanosis or edema.  Negative Tinel and Phalen's. Pulses: 2+ and symmetric Skin: Skin color, texture, turgor normal. No rashes or lesions Lymph nodes: Cervical, supraclavicular, and axillary nodes normal. Neurologic: Alert and oriented X 3, normal strength and tone. Normal symmetric reflexes. Normal coordination and gait    Assessment:    Healthy male exam with R wrist pain     Plan:     Anticipatory guidance given including wearing seatbelts, smoke detectors in the home, increasing physical activity,  increasing p.o. intake of water and vegetables. We will obtain labs -Given handout -Next CPE in 1 year See After Visit Summary for Counseling Recommendations    Right wrist pain  -discussed possible causes including strain, carpal tunnel -Supportive care including MS, ice, heat, rest, OTC topical analgesic. -Consider wrist splint and ergonomic modifications for workspace -Prednisone taper offered, patient declines at this time. -F/u continued symptoms - Plan: CBC with Differential/Platelet  Tachycardia  -Continue follow-up with cardiology prn - Plan: Basic metabolic panel, CBC with Differential/Platelet, T4, Free, TSH  Follow-up in the next few weeks for continued wrist pain  Abbe Amsterdam, MD

## 2019-07-29 NOTE — Telephone Encounter (Signed)
Just an FYI. We have made several attempts to contact this patient including sending a letter to schedule or reschedule their echocardiogram. We will be removing the patient from the echo WQ.  07/29/19 MAILED LETTER [facility]  07/26/19 [facility] to schedule @ 2:58/[facility]  06/30/021 [facility] to schedule @ 8:30/[facility]  07/18/2019 [facility] to schedule @ 11:12/[facility] (pt was in office Friday)      Thank you

## 2019-08-04 ENCOUNTER — Telehealth: Payer: Self-pay | Admitting: *Deleted

## 2019-08-04 NOTE — Telephone Encounter (Signed)
-----   Message from Judson Roch sent at 07/22/2019  8:05 AM EDT ----- Good morning,  We have made many attempts to contact this patient with no response.  Thank you!  ----- Message ----- From: Burnell Blanks, LPN Sent: [id number]/[id number]   3:09 PM EDT To: Cv Div Nl Pcc, Cv Div Nl Scheduling  HELLO ALL Patient needs echo  [employer]

## 2019-08-09 ENCOUNTER — Telehealth: Payer: Self-pay

## 2019-08-09 ENCOUNTER — Ambulatory Visit (INDEPENDENT_AMBULATORY_CARE_PROVIDER_SITE_OTHER): Payer: 59 | Admitting: Gastroenterology

## 2019-08-09 ENCOUNTER — Encounter: Payer: Self-pay | Admitting: Gastroenterology

## 2019-08-09 VITALS — BP 120/84 | HR 91 | Ht 69.0 in | Wt 209.5 lb

## 2019-08-09 DIAGNOSIS — K649 Unspecified hemorrhoids: Secondary | ICD-10-CM | POA: Diagnosis not present

## 2019-08-09 DIAGNOSIS — K219 Gastro-esophageal reflux disease without esophagitis: Secondary | ICD-10-CM | POA: Diagnosis not present

## 2019-08-09 DIAGNOSIS — R1013 Epigastric pain: Secondary | ICD-10-CM

## 2019-08-09 DIAGNOSIS — K640 First degree hemorrhoids: Secondary | ICD-10-CM

## 2019-08-09 DIAGNOSIS — K921 Melena: Secondary | ICD-10-CM | POA: Diagnosis not present

## 2019-08-09 MED ORDER — OMEPRAZOLE 40 MG PO CPDR
40.0000 mg | DELAYED_RELEASE_CAPSULE | ORAL | 3 refills | Status: DC
Start: 2019-08-09 — End: 2020-02-22

## 2019-08-09 MED ORDER — ESOMEPRAZOLE MAGNESIUM 40 MG PO PACK
40.0000 mg | PACK | Freq: Two times a day (BID) | ORAL | 3 refills | Status: DC
Start: 2019-08-09 — End: 2020-02-22

## 2019-08-09 NOTE — Progress Notes (Signed)
Chief Complaint: Hematochezia, abdominal pain, reflux  Referring Provider:     Deeann Saint, MD   HPI:    Jimmy Russo is a 29 y.o. male referred to the Gastroenterology Clinic for evaluation of multiple GI symptoms, to include hematochezia, abdominal pain, and reflux symptoms.  Hematochezia has been present intermittently for the last year or so, described as BRBPR. Initial episode of filling commode and soaking tissue paper, and since then with scant BRB on tissue paper. No dyschezia, but occasional itching. No meds trialed. Last episode 2-3 months ago. No change in bowel habits.  No associated weight loss, fever, chills, anemia, etc.  Separately, long-standing hx of post prandial HB, regurgitation and dyspepsia. Worse with greasy, fried foods, tomato based sauces. No dysphagia, odynophagia. Trialed omeprazole x2 months with improvement, and stopped on his own. Some improvement with dietary mods. Occasional Tums. Has 3-4 episodes/week. Rare nocturnal sxs.   No weight loss, fever, chills.   History of bicuspid aortic valve and tachycardia.  Recently evaluated in the Cardiology Clinic on 07/15/2019.  Reviewed labs from 07/22/2019: Normal CBC and BMP.  Normal TSH.  -RUQ [place] (03/2017): Small amount of GB sludge, otherwise no stones, normal CBD  Past Medical History:  Diagnosis Date  . Bicuspid aortic valve      Past Surgical History:  Procedure Laterality Date  . HYDROCELE EXCISION     Family History  Problem Relation Age of Onset  . Hyperlipidemia Father   . Colon cancer Neg Hx   . Esophageal cancer Neg Hx    Social History   Tobacco Use  . Smoking status: Former Games developer  . Smokeless tobacco: Former Neurosurgeon    Types: Engineer, drilling  . Vaping Use: Former  Substance Use Topics  . Alcohol use: Yes    Comment: ocass  . Drug use: No   Current Outpatient Medications  Medication Sig Dispense Refill  . fluticasone (FLONASE) 50 MCG/ACT nasal spray  Place 1 spray into both nostrils as needed for allergies or rhinitis.     No current facility-administered medications for this visit.   No Known Allergies   Review of Systems: All systems reviewed and negative except where noted in HPI.     Physical Exam:    Wt Readings from Last 3 Encounters:  08/09/19 209 lb 8 oz (95 kg)  07/22/19 208 lb 3.2 oz (94.4 kg)  07/15/19 204 lb (92.5 kg)    BP 120/84   Pulse 91   Ht 5\' 9"  (1.753 m)   Wt 209 lb 8 oz (95 kg)   BMI 30.94 kg/m  Constitutional:  Pleasant, in no acute distress. Psychiatric: Normal mood and affect. Behavior is normal. EENT: Pupils normal.  Conjunctivae are normal. No scleral icterus. Neck supple. No cervical LAD. Cardiovascular: Normal rate, regular rhythm. No edema Pulmonary/chest: Effort normal and breath sounds normal. No wheezing, rales or rhonchi. Abdominal: Soft, nondistended, nontender. Bowel sounds active throughout. There are no masses palpable. No hepatomegaly. Neurological: Alert and oriented to person place and time. Skin: Skin is warm and dry. No rashes noted. Rectal exam: Sensation intact and preserved anal wink. No external anal fissures, external hemorrhoids or skin tags. Normal sphincter tone. No palpable mass. No blood on the exam glove.  Anoscopy 2 columns of grade 1 internal hemorrhoids.  (Chaperone: , CMA).     ASSESSMENT AND PLAN;   1) GERD 2) Dyspepsia  -  Trial course of omeprazole 40 mg PO BID x4 weeks for diagnostic and therapeutic intent, then reduce to lowest effective dose - Disucssed role of EGD to eval for EE, LES laxity, HH, etc, and he opted for trial of medical management first - Discussed antireflux dietary/lifestyle mods  3) Hematochezia 4) Internal Hemorrhoids -Intermittent scant BRBPR with anoscopy today with 2 columns of small grade 1 internal hemorrhoids.  Discussed colonoscopy to evaluate for more proximal etiology, but he politely declined in favor of  conservative management -Increase dietary fiber.  Provided with fiber chart handout today with detailed instructions -Encouraged to consume >64 oz water/fluids per day -Avoid prolonged time on toilet, straining to have BM, etc. -Would be okay to use OTC hemorrhoid therapy if flare of symptoms -If recurrent issues or other concerns, can return to clinic and discuss role/timing of colonoscopy -No associated anemia or other "red flag" symptoms  I spent 35 minutes of time, including in depth chart review, independent review of results as outlined above, communicating results with the patient directly, face-to-face time with the patient, coordinating care, ordering studies and medications as appropriate, and documentation.    Shellia Cleverly, DO, FACG  08/09/2019, 9:40 AM   Deeann Saint, MD

## 2019-08-09 NOTE — Patient Instructions (Addendum)
If you are age 29 or older, your body mass index should be between 23-30. Your Body mass index is 30.94 kg/m. If this is out of the aforementioned range listed, please consider follow up with your Primary Care Provider.  If you are age 66 or younger, your body mass index should be between 19-25. Your Body mass index is 30.94 kg/m. If this is out of the aformentioned range listed, please consider follow up with your Primary Care Provider.   We have sent the following medications to your pharmacy for you to pick up at your convenience: Omeprazole 40 mg twice daily x four weeks, then once daily.   See Fiber chart attached.  Try over-the-counter Fiber Supplement - Citrucel or Benefiber.  Follow up in six months. Please call the office for an appointment as the schedule is not available at this time.  It was a pleasure to see you today!  Doristine Locks, D.O.  Fiber Chart  You should be consuming 25-30g of fiber per day and drinking 8 glasses of water to help your bowels move regularly.  In the chart below you can look up how much fiber you are getting in an average day.  If you are not getting enough fiber, you should add a fiber supplement to your diet.  Examples of this include Metamucil, FiberCon and Citrucel.  These can be purchased at your local grocery store or pharmacy.      [email or web address]

## 2019-08-09 NOTE — Telephone Encounter (Signed)
LVM for patient to call back.   Please let patient know that new script of Nexium 40 has been set to pharmacy of [facility] on [address] since Prilosec 40mg  isn't covered by insurance

## 2019-08-19 ENCOUNTER — Ambulatory Visit (HOSPITAL_COMMUNITY): Payer: 59 | Attending: Cardiovascular Disease

## 2019-08-19 DIAGNOSIS — Q231 Congenital insufficiency of aortic valve: Secondary | ICD-10-CM

## 2019-08-19 DIAGNOSIS — I77810 Thoracic aortic ectasia: Secondary | ICD-10-CM | POA: Diagnosis not present

## 2019-08-19 LAB — ECHOCARDIOGRAM COMPLETE
AR max vel: 1.06 cm2
AV Area VTI: 1.21 cm2
AV Area mean vel: 1.04 cm2
AV Mean grad: 17 mmHg
AV Peak grad: 27.7 mmHg
Ao pk vel: 2.63 m/s
Area-P 1/2: 2.26 cm2
S' Lateral: 2.1 cm

## 2019-08-31 NOTE — Telephone Encounter (Signed)
Called patient  Per [facility] representative Jujan patient doesn't have active insurance. Need to know if patient has tried to pick up any medications

## 2019-09-01 NOTE — Telephone Encounter (Signed)
Called patient and LVM for patient to call back.

## 2019-09-12 NOTE — Telephone Encounter (Signed)
Called and left voicemail for patient to call back and letter sent out to patient

## 2019-10-01 IMAGING — US US ABDOMEN LIMITED
1 series · 14 of 25 positions shown · non-contrast
Comparison: None.

CLINICAL DATA: Initial evaluation for right upper quadrant pain for
8 months, nausea.

EXAM:
ULTRASOUND ABDOMEN LIMITED RIGHT UPPER QUADRANT

[Series 1: us abdomen limited · 0.22mm/px · 14 of 40 slices shown]
[im 1/40]
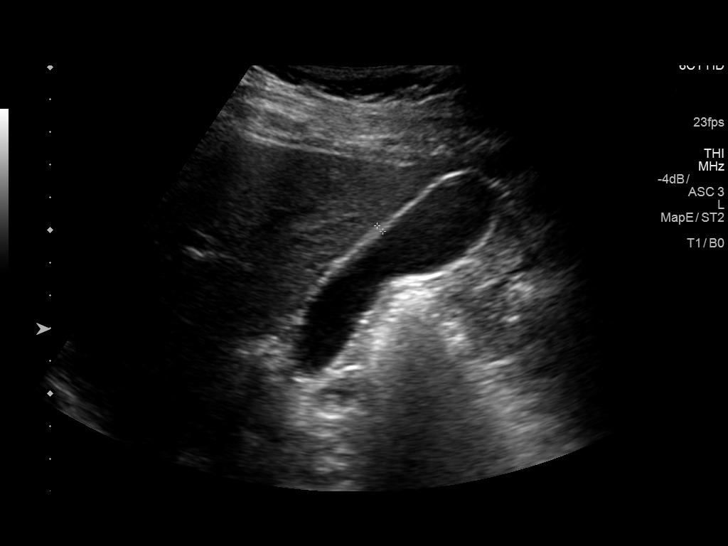
[im 4/40]
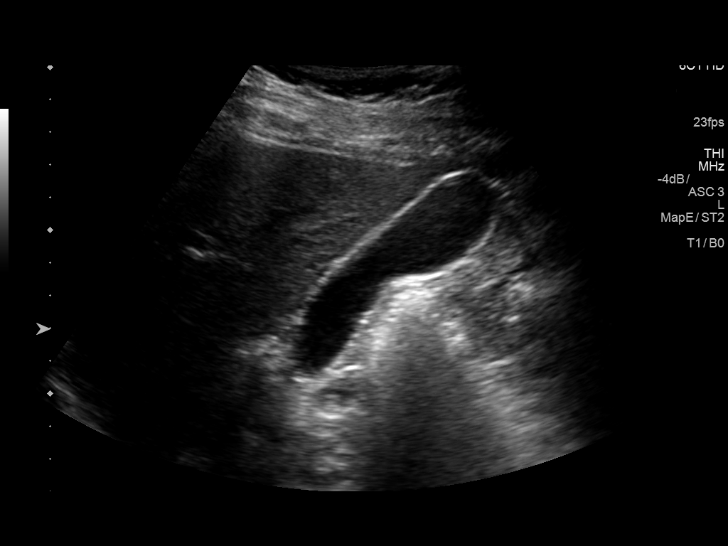
[im 7/40]
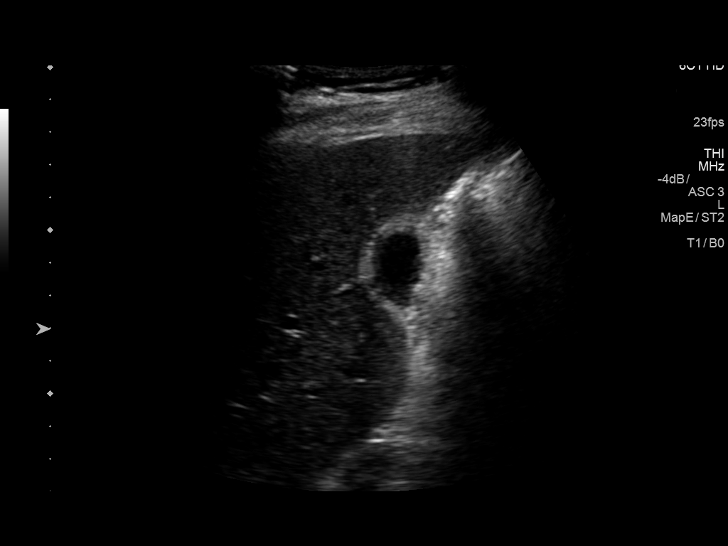
[im 10/40]
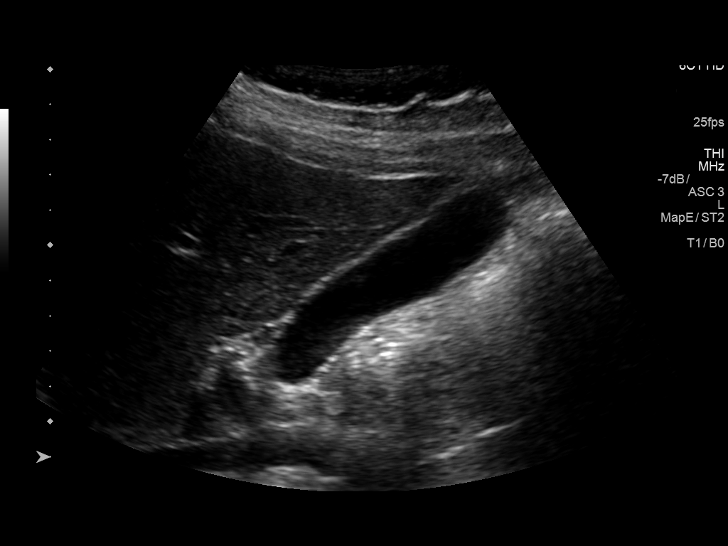
[im 14/40]
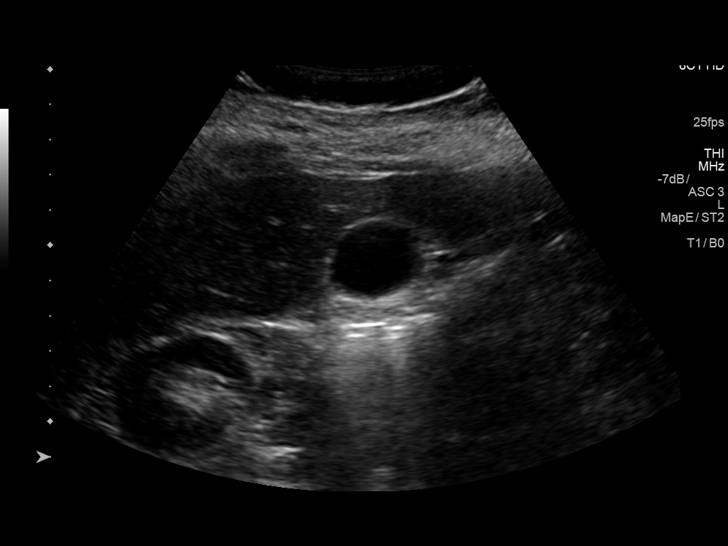
[im 15/40]
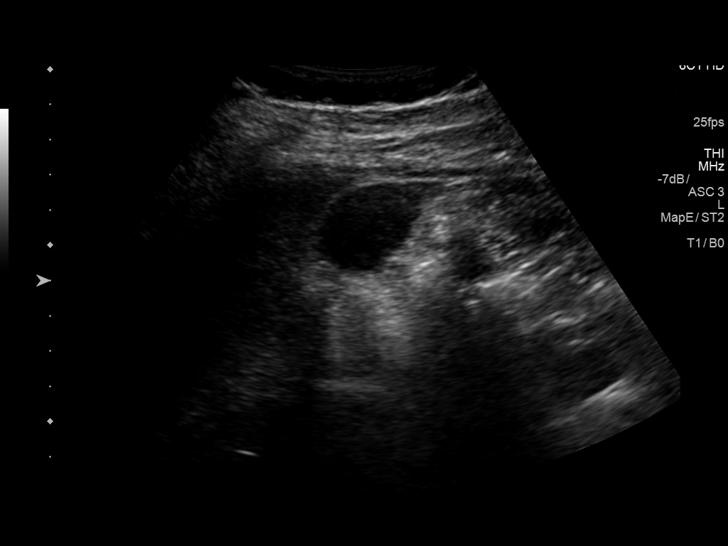
[im 18/40]
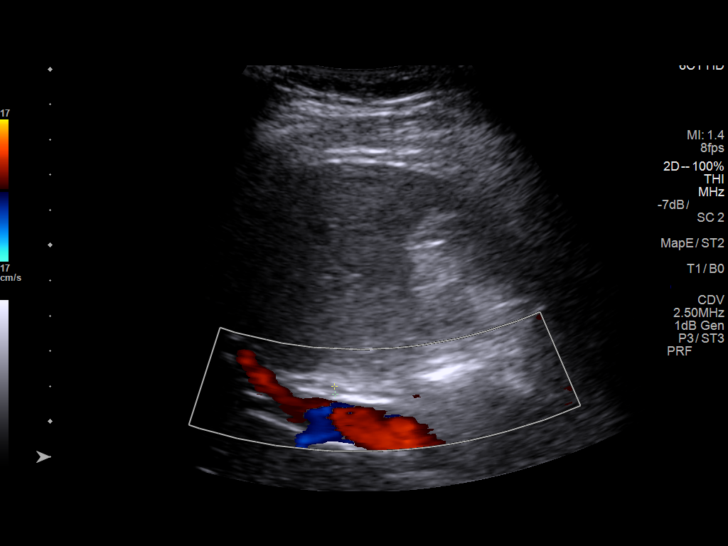
[im 22/40]
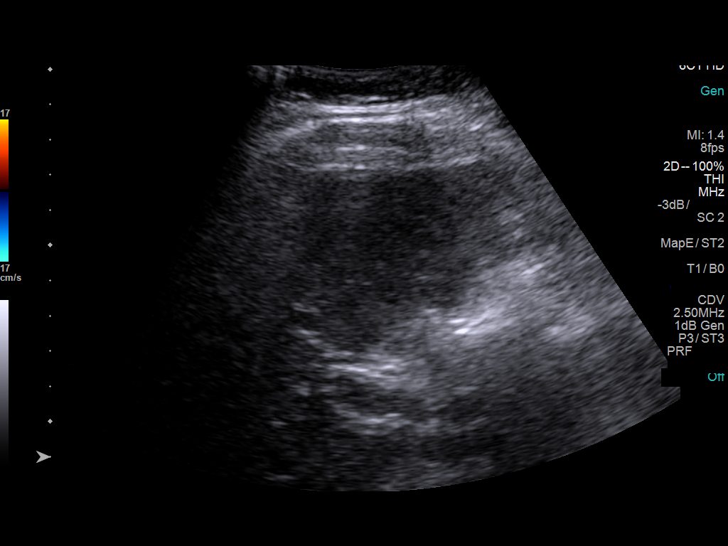
[im 25/40]
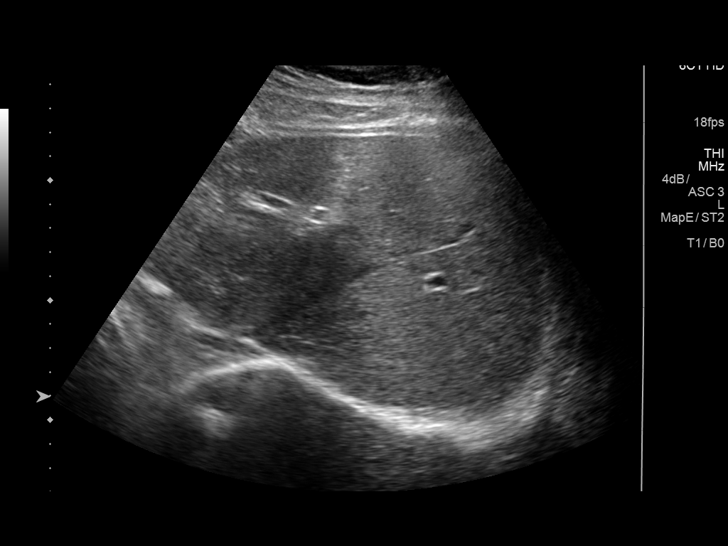
[im 27/40]
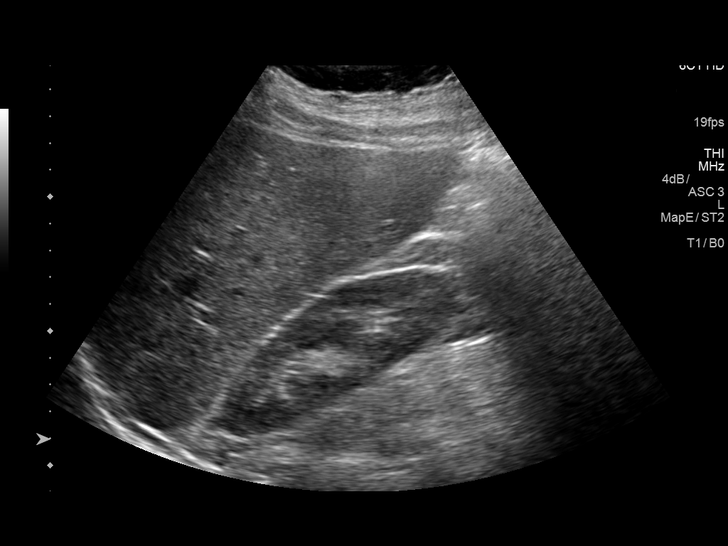
[im 30/40]
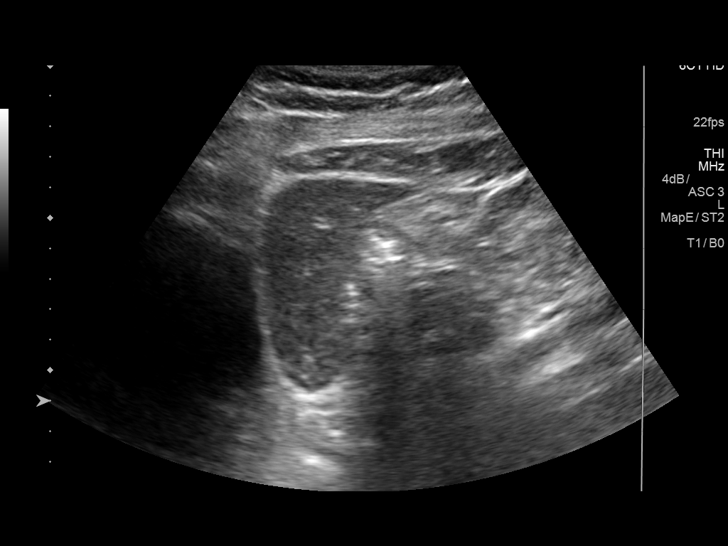
[im 33/40]
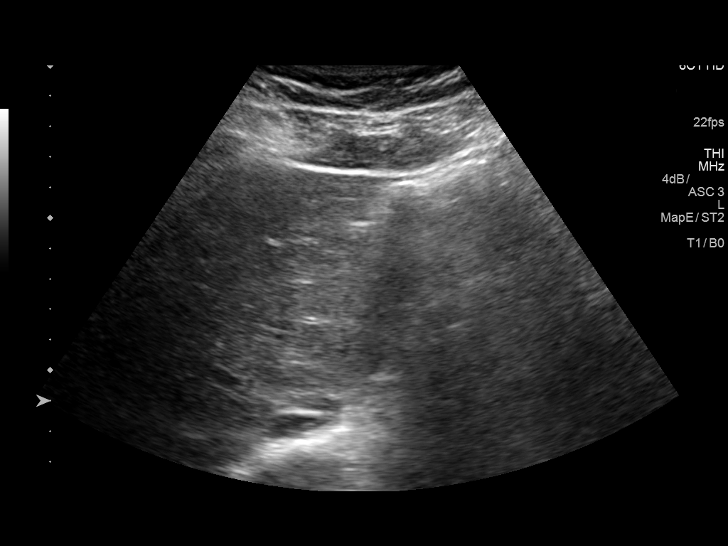
[im 36/40]
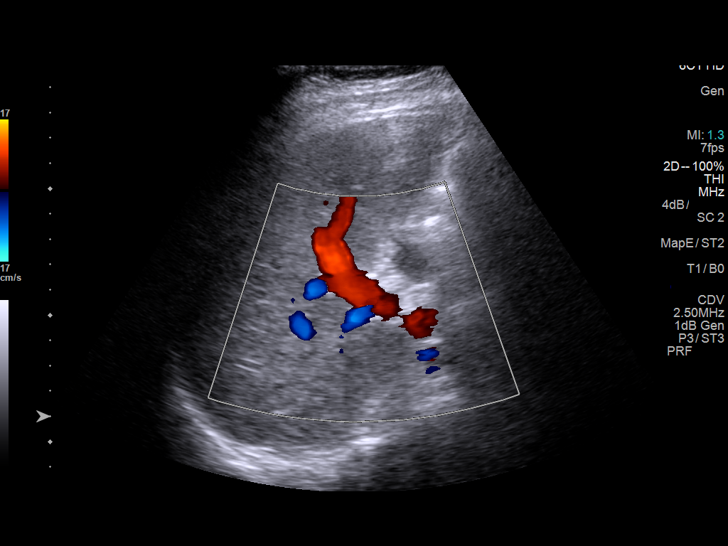
[im 40/40]
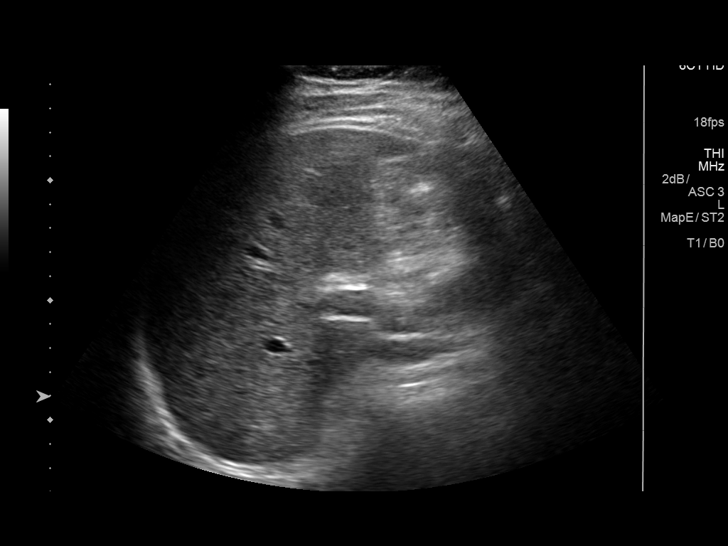

[14 of 25 positions shown; findings below may reference images not displayed]

FINDINGS: Gallbladder:

No gallstones or wall thickening visualized. Small amount of
gallbladder sludge. No sonographic Murphy sign noted by sonographer.

Common bile duct:

Diameter: 2.4 mm

Liver:

No focal lesion identified. Within normal limits in parenchymal
echogenicity. Portal vein is patent on color Doppler imaging with
normal direction of blood flow towards the liver.
IMPRESSION: 1. Gallbladder sludge without cholelithiasis or imaging findings to
suggest acute cholecystitis.
2. No biliary dilatation.

## 2019-10-04 NOTE — Telephone Encounter (Signed)
Patient is returning your call.  

## 2019-11-02 NOTE — Telephone Encounter (Signed)
Pt is requesting a call back from a nurse. 

## 2019-11-02 NOTE — Telephone Encounter (Signed)
Got forward twice by this patient at this number

## 2020-02-21 ENCOUNTER — Other Ambulatory Visit: Payer: Self-pay

## 2020-02-22 ENCOUNTER — Encounter: Payer: Self-pay | Admitting: Family Medicine

## 2020-02-22 ENCOUNTER — Ambulatory Visit (INDEPENDENT_AMBULATORY_CARE_PROVIDER_SITE_OTHER): Payer: 59 | Admitting: Family Medicine

## 2020-02-22 VITALS — BP 118/80 | HR 107 | Temp 98.6°F | Ht 69.0 in | Wt 203.0 lb

## 2020-02-22 DIAGNOSIS — F429 Obsessive-compulsive disorder, unspecified: Secondary | ICD-10-CM | POA: Diagnosis not present

## 2020-02-22 DIAGNOSIS — F321 Major depressive disorder, single episode, moderate: Secondary | ICD-10-CM

## 2020-02-22 DIAGNOSIS — F411 Generalized anxiety disorder: Secondary | ICD-10-CM | POA: Diagnosis not present

## 2020-02-22 MED ORDER — SERTRALINE HCL 25 MG PO TABS: 25.0000 mg | ORAL_TABLET | Freq: Every day | ORAL | 1 refills | Status: DC

## 2020-02-22 MED ORDER — HYDROXYZINE HCL 25 MG PO TABS: 12.5000 mg | ORAL_TABLET | Freq: Three times a day (TID) | ORAL | 1 refills | Status: DC | PRN

## 2020-02-22 NOTE — Patient Instructions (Addendum)
[email or web address]">  Generalized Anxiety Disorder, Adult Generalized anxiety disorder (GAD) is a mental health condition. Unlike normal worries, anxiety related to GAD is not triggered by a specific event. These worries do not fade or get better with time. GAD interferes with relationships, work, and school. GAD symptoms can vary from mild to severe. People with severe GAD can have intense waves of anxiety with physical symptoms that are similar to panic attacks. What are the causes? The exact cause of GAD is not known, but the following are believed to have an impact:  Differences in natural brain chemicals.  Genes passed down from parents to children.  Differences in the way threats are perceived.  Development during childhood.  Personality. What increases the risk? The following factors may make you more likely to develop this condition:  Being male.  Having a family history of anxiety disorders.  Being very shy.  Experiencing very stressful life events, such as the death of a loved one.  Having a very stressful family environment. What are the signs or symptoms? People with GAD often worry excessively about many things in their lives, such as their health and family. Symptoms may also include:  Mental and emotional symptoms: ? Worrying excessively about natural disasters. ? Fear of being late. ? Difficulty concentrating. ? Fears that others are judging your performance.  Physical symptoms: ? Fatigue. ? Headaches, muscle tension, muscle twitches, trembling, or feeling shaky. ? Feeling like your heart is pounding or beating very fast. ? Feeling out of breath or like you cannot take a deep breath. ? Having trouble falling asleep or staying asleep, or experiencing restlessness. ? Sweating. ? Nausea, diarrhea, or irritable bowel syndrome (IBS).  Behavioral symptoms: ? Experiencing erratic moods or irritability. ? Avoidance of new situations. ? Avoidance of  people. ? Extreme difficulty making decisions. How is this diagnosed? This condition is diagnosed based on your symptoms and medical history. You will also have a physical exam. Your health care provider may perform tests to rule out other possible causes of your symptoms. To be diagnosed with GAD, a person must have anxiety that:  Is out of his or her control.  Affects several different aspects of his or her life, such as work and relationships.  Causes distress that makes him or her unable to take part in normal activities.  Includes at least three symptoms of GAD, such as restlessness, fatigue, trouble concentrating, irritability, muscle tension, or sleep problems. Before your health care provider can confirm a diagnosis of GAD, these symptoms must be present more days than they are not, and they must last for 6 months or longer. How is this treated? This condition may be treated with:  Medicine. Antidepressant medicine is usually prescribed for long-term daily control. Anti-anxiety medicines may be added in severe cases, especially when panic attacks occur.  Talk therapy (psychotherapy). Certain types of talk therapy can be helpful in treating GAD by providing support, education, and guidance. Options include: ? Cognitive behavioral therapy (CBT). People learn coping skills and self-calming techniques to ease their physical symptoms. They learn to identify unrealistic thoughts and behaviors and to replace them with more appropriate thoughts and behaviors. ? Acceptance and commitment therapy (ACT). This treatment teaches people how to be mindful as a way to cope with unwanted thoughts and feelings. ? Biofeedback. This process trains you to manage your body's response (physiological response) through breathing techniques and relaxation methods. You will work with a therapist while machines are used to monitor your physical   symptoms.  Stress management techniques. These include yoga,  meditation, and exercise. A mental health specialist can help determine which treatment is best for you. Some people see improvement with one type of therapy. However, other people require a combination of therapies.   Follow these instructions at home: Lifestyle  Maintain a consistent routine and schedule.  Anticipate stressful situations. Create a plan, and allow extra time to work with your plan.  Practice stress management or self-calming techniques that you have learned from your therapist or your health care provider. General instructions  Take over-the-counter and prescription medicines only as told by your health care provider.  Understand that you are likely to have setbacks. Accept this and be kind to yourself as you persist to take better care of yourself.  Recognize and accept your accomplishments, even if you judge them as small.  Keep all follow-up visits as told by your health care provider. This is important. Contact a health care provider if:  Your symptoms do not get better.  Your symptoms get worse.  You have signs of depression, such as: ? A persistently sad or irritable mood. ? Loss of enjoyment in activities that used to bring you joy. ? Change in weight or eating. ? Changes in sleeping habits. ? Avoiding friends or family members. ? Loss of energy for normal tasks. ? Feelings of guilt or worthlessness. Get help right away if:  You have serious thoughts about hurting yourself or others. If you ever feel like you may hurt yourself or others, or have thoughts about taking your own life, get help right away. Go to your nearest emergency department or:  Call your local emergency services (911 in the [place]  Call a suicide crisis helpline, such as the [facility] at (850)760-0973. This is open 24 hours a day in the [place]  Text the Crisis Text Line at (463)570-3490 (in the [place].). Summary  Generalized anxiety disorder (GAD) is a mental  health condition that involves worry that is not triggered by a specific event.  People with GAD often worry excessively about many things in their lives, such as their health and family.  GAD may cause symptoms such as restlessness, trouble concentrating, sleep problems, frequent sweating, nausea, diarrhea, headaches, and trembling or muscle twitching.  A mental health specialist can help determine which treatment is best for you. Some people see improvement with one type of therapy. However, other people require a combination of therapies. This information is not intended to replace advice given to you by your health care provider. Make sure you discuss any questions you have with your health care provider. Document Revised: 10/27/2018 Document Reviewed: 10/27/2018 Elsevier Patient Education  [place], Adult After being diagnosed with an anxiety disorder, you may be relieved to know why you have felt or behaved a certain way. You may also feel overwhelmed about the treatment ahead and what it will mean for your life. With care and support, you can manage this condition and recover from it. How to manage lifestyle changes Managing stress and anxiety Stress is your body's reaction to life changes and events, both good and bad. Most stress will last just a few hours, but stress can be ongoing and can lead to more than just stress. Although stress can play a major role in anxiety, it is not the same as anxiety. Stress is usually caused by something external, such as a deadline, test, or competition. Stress normally passes after the triggering  event has ended.  Anxiety is caused by something internal, such as imagining a terrible outcome or worrying that something will go wrong that will devastate you. Anxiety often does not go away even after the triggering event is over, and it can become long-term (chronic) worry. It is important to understand the differences between  stress and anxiety and to manage your stress effectively so that it does not lead to an anxious response. Talk with your health care provider or a counselor to learn more about reducing anxiety and stress. He or she may suggest tension reduction techniques, such as:  Music therapy. This can include creating or listening to music that you enjoy and that inspires you.  Mindfulness-based meditation. This involves being aware of your normal breaths while not trying to control your breathing. It can be done while sitting or walking.  Centering prayer. This involves focusing on a word, phrase, or sacred image that means something to you and brings you peace.  Deep breathing. To do this, expand your stomach and inhale slowly through your nose. Hold your breath for 3-5 seconds. Then exhale slowly, letting your stomach muscles relax.  Self-talk. This involves identifying thought patterns that lead to anxiety reactions and changing those patterns.  Muscle relaxation. This involves tensing muscles and then relaxing them. Choose a tension reduction technique that suits your lifestyle and personality. These techniques take time and practice. Set aside 5-15 minutes a day to do them. Therapists can offer counseling and training in these techniques. The training to help with anxiety may be covered by some insurance plans. Other things you can do to manage stress and anxiety include:  Keeping a stress/anxiety diary. This can help you learn what triggers your reaction and then learn ways to manage your response.  Thinking about how you react to certain situations. You may not be able to control everything, but you can control your response.  Making time for activities that help you relax and not feeling guilty about spending your time in this way.  Visual imagery and yoga can help you stay calm and relax.   Medicines Medicines can help ease symptoms. Medicines for anxiety include:  Anti-anxiety  drugs.  Antidepressants. Medicines are often used as a primary treatment for anxiety disorder. Medicines will be prescribed by a health care provider. When used together, medicines, psychotherapy, and tension reduction techniques may be the most effective treatment. Relationships Relationships can play a big part in helping you recover. Try to spend more time connecting with trusted friends and family members. Consider going to couples counseling, taking family education classes, or going to family therapy. Therapy can help you and others better understand your condition. How to recognize changes in your anxiety Everyone responds differently to treatment for anxiety. Recovery from anxiety happens when symptoms decrease and stop interfering with your daily activities at home or work. This may mean that you will start to:  Have better concentration and focus. Worry will interfere less in your daily thinking.  Sleep better.  Be less irritable.  Have more energy.  Have improved memory. It is important to recognize when your condition is getting worse. Contact your health care provider if your symptoms interfere with home or work and you feel like your condition is not improving. Follow these instructions at home: Activity  Exercise. Most adults should do the following: ? Exercise for at least 150 minutes each week. The exercise should increase your heart rate and make you sweat (moderate-intensity exercise). ? Strengthening   exercises at least twice a week.  Get the right amount and quality of sleep. Most adults need 7-9 hours of sleep each night. Lifestyle  Eat a healthy diet that includes plenty of vegetables, fruits, whole grains, low-fat dairy products, and lean protein. Do not eat a lot of foods that are high in solid fats, added sugars, or salt.  Make choices that simplify your life.  Do not use any products that contain nicotine or tobacco, such as cigarettes, e-cigarettes, and  chewing tobacco. If you need help quitting, ask your health care provider.  Avoid caffeine, alcohol, and certain over-the-counter cold medicines. These may make you feel worse. Ask your pharmacist which medicines to avoid.   General instructions  Take over-the-counter and prescription medicines only as told by your health care provider.  Keep all follow-up visits as told by your health care provider. This is important. Where to find support You can get help and support from these sources:  Self-help groups.  Online and [employer].  A trusted spiritual leader.  Couples counseling.  Family education classes.  Family therapy. Where to find more information You may find that joining a support group helps you deal with your anxiety. The following sources can help you locate counselors or support groups near you:  Mental Health America: www.mentalhealthamerica.net  Anxiety and Depression Association of [place] (ADAA): ProgramCam.de  [employer] on Mental Illness (NAMI): www.nami.org Contact a health care provider if you:  Have a hard time staying focused or finishing daily tasks.  Spend many hours a day feeling worried about everyday life.  Become exhausted by worry.  Start to have headaches, feel tense, or have nausea.  Urinate more than normal.  Have diarrhea. Get help right away if you have:  A racing heart and shortness of breath.  Thoughts of hurting yourself or others. If you ever feel like you may hurt yourself or others, or have thoughts about taking your own life, get help right away. You can go to your nearest emergency department or call:  Your local emergency services (911 in the [place]  A suicide crisis helpline, such as the National Suicide Prevention Lifeline at (317) 464-2223. This is open 24 hours a day. Summary  Taking steps to learn and use tension reduction techniques can help calm you and help prevent triggering an anxiety  reaction.  When used together, medicines, psychotherapy, and tension reduction techniques may be the most effective treatment.  Family, friends, and partners can play a big part in helping you recover from an anxiety disorder. This information is not intended to replace advice given to you by your health care provider. Make sure you discuss any questions you have with your health care provider. Document Revised: 06/08/2018 Document Reviewed: 06/08/2018 Elsevier Patient Education  2021 Elsevier Inc.  Managing Depression, Adult Depression is a mental health condition that affects your thoughts, feelings, and actions. Being diagnosed with depression can bring you relief if you did not know why you have felt or behaved a certain way. It could also leave you feeling overwhelmed with uncertainty about your future. Preparing yourself to manage your symptoms can help you feel more positive about your future. How to manage lifestyle changes Managing stress Stress is your body's reaction to life changes and events, both good and bad. Stress can add to your feelings of depression. Learning to manage your stress can help lessen your feelings of depression. Try some of the following approaches to reducing your stress (stress reduction techniques):  Listen  to music that you enjoy and that inspires you.  Try using a meditation app or take a meditation class.  Develop a practice that helps you connect with your spiritual self. Walk in nature, pray, or go to a place of worship.  Do some deep breathing. To do this, inhale slowly through your nose. Pause at the top of your inhale for a few seconds and then exhale slowly, letting your muscles relax.  Practice yoga to help relax and work your muscles. Choose a stress reduction technique that suits your lifestyle and personality. These techniques take time and practice to develop. Set aside 5-15 minutes a day to do them. Therapists can offer training in these  techniques. Other things you can do to manage stress include:  Keeping a stress diary.  Knowing your limits and saying no when you think something is too much.  Paying attention to how you react to certain situations. You may not be able to control everything, but you can change your reaction.  Adding humor to your life by watching funny films or TV shows.  Making time for activities that you enjoy and that relax you.   Medicines Medicines, such as antidepressants, are often a part of treatment for depression.  Talk with your pharmacist or health care provider about all the medicines, supplements, and herbal products that you take, their possible side effects, and what medicines and other products are safe to take together.  Make sure to report any side effects you may have to your health care provider. Relationships Your health care provider may suggest family therapy, couples therapy, or individual therapy as part of your treatment. How to recognize changes Everyone responds differently to treatment for depression. As you recover from depression, you may start to:  Have more interest in doing activities.  Feel less hopeless.  Have more energy.  Overeat less often, or have a better appetite.  Have better mental focus. It is important to recognize if your depression is not getting better or is getting worse. The symptoms you had in the beginning may return, such as:  Tiredness (fatigue) or low energy.  Eating too much or too little.  Sleeping too much or too little.  Feeling restless, agitated, or hopeless.  Trouble focusing or making decisions.  Unexplained physical complaints.  Feeling irritable, angry, or aggressive. If you or your family members notice these symptoms coming back, let your health care provider know right away. Follow these instructions at home: Activity  Try to get some form of exercise each day, such as walking, biking, swimming, or lifting  weights.  Practice stress reduction techniques.  Engage your mind by taking a class or doing some volunteer work.   Lifestyle  Get the right amount and quality of sleep.  Cut down on using caffeine, tobacco, alcohol, and other potentially harmful substances.  Eat a healthy diet that includes plenty of vegetables, fruits, whole grains, low-fat dairy products, and lean protein. Do not eat a lot of foods that are high in solid fats, added sugars, or salt (sodium). General instructions  Take over-the-counter and prescription medicines only as told by your health care provider.  Keep all follow-up visits as told by your health care provider. This is important. Where to find support Talking to others Friends and family members can be sources of support and guidance. Talk to trusted friends or family members about your condition. Explain your symptoms to them, and let them know that you are working with a health  care provider to treat your depression. Tell friends and family members how they also can be helpful.   Finances  Find appropriate mental health providers that fit with your financial situation.  Talk with your health care provider about options to get reduced prices on your medicines. Where to find more information You can find support in your area from:  Anxiety and Depression Association of America (ADAA): www.adaa.org  Mental Health America: www.mentalhealthamerica.net  The [employer] on Mental Illness: www.nami.org Contact a health care provider if:  You stop taking your antidepressant medicines, and you have any of these symptoms: ? Nausea. ? Headache. ? Light-headedness. ? Chills and body aches. ? Not being able to sleep (insomnia).  You or your friends and family think your depression is getting worse. Get help right away if:  You have thoughts of hurting yourself or others. If you ever feel like you may hurt yourself or others, or have thoughts about taking  your own life, get help right away. Go to your nearest emergency department or:  Call your local emergency services (911 in the [place]  Call a suicide crisis helpline, such as the National Suicide Prevention Lifeline at 626-087-5727. This is open 24 hours a day in the [place]  Text the Crisis Text Line at 857-733-0390 (in the [place] Summary  If you are diagnosed with depression, preparing yourself to manage your symptoms is a good way to feel positive about your future.  Work with your health care provider on a management plan that includes stress reduction techniques, medicines (if applicable), therapy, and healthy lifestyle habits.  Keep talking with your health care provider about how your treatment is working.  If you have thoughts about taking your own life, call a suicide crisis helpline or text a crisis text line. This information is not intended to replace advice given to you by your health care provider. Make sure you discuss any questions you have with your health care provider. Document Revised: 11/17/2018 Document Reviewed: 11/17/2018 Elsevier Patient Education  2021 [employer].

## 2020-02-22 NOTE — Progress Notes (Signed)
Subjective:    Patient ID: Jimmy Russo, male    DOB: 1990-09-29, 30 y.o.   MRN: [id number]  No chief complaint on file.   HPI Patient was seen today for ongoing concern.  Patient with increased anxiety, OCD, depression symptoms over the last few months after having a panic attack.  Patient currently in counseling.  Patient notes recent fall keep him from sleeping at night.  Patient waking up feeling unrested.  Endorses ruminating thoughts and frequent handwashing.  Denies having to check counseling.  Patient denies previously being on medication or history of anxiety and depression in family.  Past Medical History:  Diagnosis Date  . Bicuspid aortic valve     No Known Allergies  ROS General: Denies fever, chills, night sweats, changes in weight, changes in appetite HEENT: Denies headaches, ear pain, changes in vision, rhinorrhea, sore throat CV: Denies CP, palpitations, SOB, orthopnea Pulm: Denies SOB, cough, wheezing GI: Denies abdominal pain, nausea, vomiting, diarrhea, constipation GU: Denies dysuria, hematuria, frequency Msk: Denies muscle cramps, joint pains Neuro: Denies weakness, numbness, tingling Skin: Denies rashes, bruising Psych: Denies hallucinations + anxiety, depression, OCD     Objective:    Blood pressure 118/80, pulse (!) 107, temperature 98.6 F (37 C), temperature source Oral, height 5\' 9"  (1.753 m), weight 203 lb (92.1 kg), SpO2 97 %.  Gen. Pleasant, well-nourished, in no distress HEENT: [place]/AT, face symmetric, conjunctiva clear, no scleral icterus, PERRLA, EOMI, nares patent without drainage Lungs: no accessory muscle use Cardiovascular: Tachycardic, noperipheral edema Musculoskeletal: No deformities, no cyanosis or clubbing, normal tone Neuro:  A&Ox3, CN II-XII intact, normal gait Skin:  Warm, no lesions/ rash Psych: Appears anxious, mood appropriate   Wt Readings from Last 3 Encounters:  02/22/20 203 lb (92.1 kg)  08/09/19 209 lb 8 oz (95  kg)  07/22/19 208 lb 3.2 oz (94.4 kg)    Lab Results  Component Value Date   WBC 5.6 07/22/2019   HGB 15.1 07/22/2019   HCT 43.8 07/22/2019   PLT 240 07/22/2019   GLUCOSE 84 07/22/2019   CHOL 173 10/16/2017   TRIG 115.0 10/16/2017   HDL 34.10 (L) 10/16/2017   LDLCALC 116 (H) 10/16/2017   ALT 13 03/27/2017   AST 13 03/27/2017   NA 139 07/22/2019   K 4.2 07/22/2019   CL 103 07/22/2019   CREATININE 0.95 07/22/2019   BUN 12 07/22/2019   CO2 31 07/22/2019   TSH 1.50 07/22/2019   HGBA1C 5.2 10/16/2017    Assessment/Plan:  GAD (generalized anxiety disorder) -GAD-7 score 16 -Continue counseling. -Discussed medication options. -We will start Zoloft 25 mg daily and Hydroxyzine 12.5 mg prn -We will follow up in 4-6 weeks. -Given handout - Plan: hydrOXYzine (ATARAX/VISTARIL) 25 MG tablet, sertraline (ZOLOFT) 25 MG tablet  Depression, major, single episode, moderate (HCC)  -PHQ 9 score 17 -continue counseling -discussed starting medication. -will start Zoloft 25 mg daily -given handout -given preacutions - Plan: hydrOXYzine (ATARAX/VISTARIL) 25 MG tablet, sertraline (ZOLOFT) 25 MG tablet  OCD, unspecified -continue counseling, CBT -will start zoloft 25 mg daily -continue to monitor  F/u in 4-6 wks, sooner if needed.  10/18/2017, MD

## 2020-04-04 ENCOUNTER — Encounter: Payer: Self-pay | Admitting: Family Medicine

## 2020-04-18 ENCOUNTER — Other Ambulatory Visit: Payer: Self-pay | Admitting: Family Medicine

## 2020-04-18 DIAGNOSIS — F411 Generalized anxiety disorder: Secondary | ICD-10-CM

## 2020-04-18 DIAGNOSIS — F321 Major depressive disorder, single episode, moderate: Secondary | ICD-10-CM

## 2020-04-25 ENCOUNTER — Ambulatory Visit (INDEPENDENT_AMBULATORY_CARE_PROVIDER_SITE_OTHER): Payer: 59 | Admitting: Family Medicine

## 2020-04-25 ENCOUNTER — Encounter: Payer: Self-pay | Admitting: Family Medicine

## 2020-04-25 ENCOUNTER — Other Ambulatory Visit: Payer: Self-pay

## 2020-04-25 VITALS — BP 122/86 | HR 52 | Temp 98.4°F | Wt 208.2 lb

## 2020-04-25 DIAGNOSIS — F429 Obsessive-compulsive disorder, unspecified: Secondary | ICD-10-CM

## 2020-04-25 DIAGNOSIS — F411 Generalized anxiety disorder: Secondary | ICD-10-CM | POA: Diagnosis not present

## 2020-04-25 DIAGNOSIS — F325 Major depressive disorder, single episode, in full remission: Secondary | ICD-10-CM

## 2020-04-25 NOTE — Progress Notes (Signed)
Subjective:    Patient ID: Jimmy Russo, male    DOB: 24-Mar-1990, 30 y.o.   MRN: [id number]  Chief Complaint  Patient presents with  . Follow-up    HPI Patient was seen today for f/u.  Pt was taking zoloft 25 mg daily, interested in stopping med.  States feels less anxious, now that the time has changed and it gets dark later.  Pt endorses good appetite, sleep, and mood.  Pt inquires about drinking EtOH while on the medication.  Pt notes hydroxyzine 12.5 mg caused patient to feel drowsy.  Past Medical History:  Diagnosis Date  . Bicuspid aortic valve     No Known Allergies  ROS General: Denies fever, chills, night sweats, changes in weight, changes in appetite HEENT: Denies headaches, ear pain, changes in vision, rhinorrhea, sore throat CV: Denies CP, palpitations, SOB, orthopnea Pulm: Denies SOB, cough, wheezing GI: Denies abdominal pain, nausea, vomiting, diarrhea, constipation GU: Denies dysuria, hematuria, frequency Msk: Denies muscle cramps, joint pains Neuro: Denies weakness, numbness, tingling Skin: Denies rashes, bruising Psych: Denies depression,hallucinations  +anxiety    Objective:    Blood pressure 122/86, pulse (!) 52, temperature 98.4 F (36.9 C), temperature source Oral, weight 208 lb 3.2 oz (94.4 kg), SpO2 99 %.  Gen. Pleasant, well-nourished, in no distress, normal affect   HEENT: /AT, face symmetric, conjunctiva clear, no scleral icterus, PERRLA, EOMI, nares patent without drainage Lungs: no accessory muscle use Cardiovascular: RRR, no peripheral edema Musculoskeletal: No deformities, no cyanosis or clubbing, normal tone Neuro:  A&Ox3, CN II-XII intact, normal gait Skin:  Warm, no lesions/ rash   Wt Readings from Last 3 Encounters:  02/22/20 203 lb (92.1 kg)  08/09/19 209 lb 8 oz (95 kg)  07/22/19 208 lb 3.2 oz (94.4 kg)    Lab Results  Component Value Date   WBC 5.6 07/22/2019   HGB 15.1 07/22/2019   HCT 43.8 07/22/2019   PLT 240  07/22/2019   GLUCOSE 84 07/22/2019   CHOL 173 10/16/2017   TRIG 115.0 10/16/2017   HDL 34.10 (L) 10/16/2017   LDLCALC 116 (H) 10/16/2017   ALT 13 03/27/2017   AST 13 03/27/2017   NA 139 07/22/2019   K 4.2 07/22/2019   CL 103 07/22/2019   CREATININE 0.95 07/22/2019   BUN 12 07/22/2019   CO2 31 07/22/2019   TSH 1.50 07/22/2019   HGBA1C 5.2 10/16/2017    Assessment/Plan:  GAD (generalized anxiety disorder)  Depression, major, single episode, complete remission (HCC)  Obsessive-compulsive disorder, unspecified type  -PHQ-9 score 3 -GAD-7 score 8 -Improving -Discussed weaning Zoloft.  Will take Zoloft 12.5 mg daily for the next 2-4 weeks then stop. -Continue self-care  F/u prn in the next 4-6 wks  Abbe Amsterdam, MD

## 2020-06-19 ENCOUNTER — Other Ambulatory Visit: Payer: Self-pay | Admitting: Family Medicine

## 2020-06-19 DIAGNOSIS — F321 Major depressive disorder, single episode, moderate: Secondary | ICD-10-CM

## 2020-06-19 DIAGNOSIS — F411 Generalized anxiety disorder: Secondary | ICD-10-CM

## 2020-06-20 ENCOUNTER — Encounter: Payer: Self-pay | Admitting: Family Medicine

## 2020-06-21 NOTE — Telephone Encounter (Signed)
Patient is calling to check the status of medication refill for Sertraline, please advise. CB is [phone]

## 2020-06-21 NOTE — Telephone Encounter (Signed)
sertraline (ZOLOFT) 25 MG tablet  [id number] - [place], [place] - [address] AT [place] [address] Phone:  [phone]  Fax:  [phone]     Patient was last seen on 04/25/2020

## 2020-06-28 ENCOUNTER — Other Ambulatory Visit: Payer: Self-pay | Admitting: Family Medicine

## 2020-06-28 DIAGNOSIS — F321 Major depressive disorder, single episode, moderate: Secondary | ICD-10-CM

## 2020-06-28 DIAGNOSIS — F411 Generalized anxiety disorder: Secondary | ICD-10-CM

## 2020-06-28 MED ORDER — SERTRALINE HCL 25 MG PO TABS: 25.0000 mg | ORAL_TABLET | Freq: Every day | ORAL | 5 refills | Status: DC

## 2020-09-03 ENCOUNTER — Encounter: Payer: Self-pay | Admitting: Family Medicine

## 2021-01-15 ENCOUNTER — Other Ambulatory Visit: Payer: Self-pay | Admitting: Family Medicine

## 2021-01-15 DIAGNOSIS — F321 Major depressive disorder, single episode, moderate: Secondary | ICD-10-CM

## 2021-01-15 DIAGNOSIS — F411 Generalized anxiety disorder: Secondary | ICD-10-CM

## 2021-01-31 ENCOUNTER — Ambulatory Visit (INDEPENDENT_AMBULATORY_CARE_PROVIDER_SITE_OTHER): Payer: 59 | Admitting: Family Medicine

## 2021-01-31 ENCOUNTER — Encounter: Payer: Self-pay | Admitting: Family Medicine

## 2021-01-31 VITALS — BP 118/82 | HR 72 | Temp 98.3°F | Wt 209.0 lb

## 2021-01-31 DIAGNOSIS — Z0000 Encounter for general adult medical examination without abnormal findings: Secondary | ICD-10-CM | POA: Diagnosis not present

## 2021-01-31 DIAGNOSIS — Z23 Encounter for immunization: Secondary | ICD-10-CM

## 2021-01-31 DIAGNOSIS — F411 Generalized anxiety disorder: Secondary | ICD-10-CM | POA: Diagnosis not present

## 2021-01-31 DIAGNOSIS — F321 Major depressive disorder, single episode, moderate: Secondary | ICD-10-CM | POA: Diagnosis not present

## 2021-01-31 DIAGNOSIS — E7841 Elevated Lipoprotein(a): Secondary | ICD-10-CM | POA: Diagnosis not present

## 2021-01-31 DIAGNOSIS — Q2381 Bicuspid aortic valve: Secondary | ICD-10-CM

## 2021-01-31 DIAGNOSIS — Q231 Congenital insufficiency of aortic valve: Secondary | ICD-10-CM

## 2021-01-31 DIAGNOSIS — I77810 Thoracic aortic ectasia: Secondary | ICD-10-CM

## 2021-01-31 LAB — CBC WITH DIFFERENTIAL/PLATELET
Basophils Absolute: 0 10*3/uL (ref 0.0–0.1)
Basophils Relative: 0.6 % (ref 0.0–3.0)
Eosinophils Absolute: 0 10*3/uL (ref 0.0–0.7)
Eosinophils Relative: 0.5 % (ref 0.0–5.0)
HCT: 44 % (ref 39.0–52.0)
Hemoglobin: 14.4 g/dL (ref 13.0–17.0)
Lymphocytes Relative: 32.6 % (ref 12.0–46.0)
Lymphs Abs: 1.6 10*3/uL (ref 0.7–4.0)
MCHC: 32.8 g/dL (ref 30.0–36.0)
MCV: 89.6 fl (ref 78.0–100.0)
Monocytes Absolute: 0.4 10*3/uL (ref 0.1–1.0)
Monocytes Relative: 8.3 % (ref 3.0–12.0)
Neutro Abs: 2.9 10*3/uL (ref 1.4–7.7)
Neutrophils Relative %: 58 % (ref 43.0–77.0)
Platelets: 227 10*3/uL (ref 150.0–400.0)
RBC: 4.91 Mil/uL (ref 4.22–5.81)
RDW: 13.1 % (ref 11.5–15.5)
WBC: 4.9 10*3/uL (ref 4.0–10.5)

## 2021-01-31 LAB — LIPID PANEL
Cholesterol: 166 mg/dL (ref 0–200)
HDL: 44 mg/dL (ref >39.00)
LDL Cholesterol: 107 mg/dL — ABNORMAL HIGH (ref 0–99)
NonHDL: 122.32
Total CHOL/HDL Ratio: 4
Triglycerides: 79 mg/dL (ref 0.0–149.0)
VLDL: 15.8 mg/dL (ref 0.0–40.0)

## 2021-01-31 LAB — BASIC METABOLIC PANEL
BUN: 16 mg/dL (ref 6–23)
CO2: 31 mEq/L (ref 19–32)
Calcium: 9.3 mg/dL (ref 8.4–10.5)
Chloride: 104 mEq/L (ref 96–112)
Creatinine, Ser: 1 mg/dL (ref 0.40–1.50)
GFR: 101.04 mL/min (ref >60.00)
Glucose, Bld: 59 mg/dL — ABNORMAL LOW (ref 70–99)
Potassium: 4.2 mEq/L (ref 3.5–5.1)
Sodium: 140 mEq/L (ref 135–145)

## 2021-01-31 LAB — T4, FREE: Free T4: 1.11 ng/dL (ref 0.60–1.60)

## 2021-01-31 LAB — TSH: TSH: 1.01 u[IU]/mL (ref 0.35–5.50)

## 2021-01-31 LAB — HEMOGLOBIN A1C: Hgb A1c MFr Bld: 5.3 % (ref 4.6–6.5)

## 2021-01-31 MED ORDER — SERTRALINE HCL 50 MG PO TABS: 50.0000 mg | ORAL_TABLET | Freq: Every day | ORAL | 4 refills | Status: DC

## 2021-01-31 NOTE — Patient Instructions (Signed)
At today's visit we discussed increasing Zoloft from 25 mg to 50 mg daily.  A new prescription was sent to your pharmacy for the increased dose.  Remember it will likely take 4-6 weeks before you fully notice a difference at the increased dose.

## 2021-01-31 NOTE — Progress Notes (Signed)
Subjective:     Jimmy Russo is a 31 y.o. male and is here for a comprehensive physical exam. The patient reports feeling increased depression/anxiety symptoms.  Unsure if it is 2/2 this time of the year or the weather.  Patient recently purchased a mood light.  States may be helping a little.  Patient inquires about increasing dose of Zoloft from 25 mg daily.  Social History   Socioeconomic History   Marital status: Single    Spouse name: Not on file   Number of children: Not on file   Years of education: Not on file   Highest education level: Not on file  Occupational History   Not on file  Tobacco Use   Smoking status: Former   Smokeless tobacco: Former    Types: Associate Professor Use: Former  Substance and Sexual Activity   Alcohol use: Yes    Comment: ocass   Drug use: No   Sexual activity: Not on file  Other Topics Concern   Not on file  Social History Narrative   Not on file   Social Determinants of Health   Financial Resource Strain: Not on file  Food Insecurity: Not on file  Transportation Needs: Not on file  Physical Activity: Not on file  Stress: Not on file  Social Connections: Not on file  Intimate Partner Violence: Not on file   Health Maintenance  Topic Date Due   COVID-19 Vaccine (1) Never done   HIV Screening  Never done   Hepatitis C Screening  Never done   TETANUS/TDAP  Never done   INFLUENZA VACCINE  Completed   Pneumococcal Vaccine 27-72 Years old  Aged Out   HPV VACCINES  Aged Out    The following portions of the patient's history were reviewed and updated as appropriate: allergies, current medications, past family history, past medical history, past social history, past surgical history, and problem list.  Review of Systems Pertinent items noted in HPI and remainder of comprehensive ROS otherwise negative.   Objective:    BP 118/82 (BP Location: Right Arm, Patient Position: Sitting, Cuff Size: Normal)    Pulse 72     Temp 98.3 F (36.8 C) (Oral)    Wt 209 lb (94.8 kg)    SpO2 97%    BMI 30.86 kg/m  General appearance: alert, cooperative, and no distress Head: Normocephalic, without obvious abnormality, atraumatic Eyes: conjunctivae/corneas clear. PERRL, EOM's intact. Fundi benign. Ears: normal TM's and external ear canals both ears Nose: Nares normal. Septum midline. Mucosa normal. No drainage or sinus tenderness. Throat: lips, mucosa, and tongue normal; teeth and gums normal Neck: no adenopathy, no carotid bruit, no JVD, supple, symmetrical, trachea midline, and thyroid not enlarged, symmetric, no tenderness/mass/nodules Lungs: clear to auscultation bilaterally Heart: regular rate and rhythm and 2/6 murmur beast heard in R upper chest, no peripheral edema Abdomen: soft, non-tender; bowel sounds normal; no masses,  no organomegaly Extremities: extremities normal, atraumatic, no cyanosis or edema Pulses: 2+ and symmetric Skin: Skin color, texture, turgor normal. No rashes or lesions Lymph nodes: Cervical, supraclavicular, and axillary nodes normal. Neurologic: Alert and oriented X 3, normal strength and tone. Normal symmetric reflexes. Normal coordination and gait    Depression screen [facility] 2/9 01/31/2021 04/25/2020 02/22/2020  Decreased Interest 1 0 1  Down, Depressed, Hopeless 2 0 2  PHQ - 2 Score 3 0 3  Altered sleeping 2 0 3  Tired, decreased energy 1 1 3   Change  in appetite 1 0 2  Feeling bad or failure about yourself  3 1 3   Trouble concentrating 0 0 3  Moving slowly or fidgety/restless 0 1 0  Suicidal thoughts 0 0 1  PHQ-9 Score 10 3 18   Difficult doing work/chores - Not difficult at all Very difficult   GAD 7 : Generalized Anxiety Score 04/25/2020 02/22/2020 07/22/2019  Nervous, Anxious, on Edge 2 3 3   Control/stop worrying 1 3 2   Worry too much - different things 1 3 2   Trouble relaxing 1 3 1   Restless 1 1 0  Easily annoyed or irritable 0 0 1  Afraid - awful might happen 2 3 2   Total GAD 7  Score 8 16 11   Anxiety Difficulty Not difficult at all Very difficult Somewhat difficult     Assessment:    Healthy male exam with increased depression symptoms.     Plan:    Anticipatory guidance given including wearing seatbelts, smoke detectors in the home, increasing physical activity, increasing p.o. intake of water and vegetables. -obtain labs -Immunizations reviewed -Given handout -Next CPE in 1 year See After Visit Summary for Counseling Recommendations   Need for influenza vaccination  - Plan: Flu Vaccine QUAD 6+ mos PF IM (Fluarix Quad PF)  GAD (generalized anxiety disorder) - Plan: TSH, T4, Free, sertraline (ZOLOFT) 50 MG tablet  Depression, major, single episode, moderate (HCC) -PHQ-9 score 10.  Previously 3 -Will increase dose of Zoloft from 25 mg daily to 50 mg daily - Plan: TSH, T4, Free, sertraline (ZOLOFT) 50 MG tablet  Elevated lipoprotein(a)  -LDL 116 on 10/16/2017 medications - Plan: Hemoglobin A1c, Lipid panel  Thoracic aortic ectasia (HCC) -Stable  Bicuspid aortic valve -02/19/2019 EF 60-65% -Schedule follow-up with cardiology  Follow-up in 4-8 weeks, sooner if needed  , MD

## 2021-02-01 ENCOUNTER — Encounter: Payer: Self-pay | Admitting: Family Medicine

## 2021-02-01 NOTE — Telephone Encounter (Signed)
Called pt, left detailed msg per FYI, informed him that Rx was sent to both pharmacies, reached out to [facility] to cancel the prescription. And that once it has been cancelled, he should be able to get it from [facility].

## 2021-03-07 ENCOUNTER — Other Ambulatory Visit: Payer: Self-pay | Admitting: Family Medicine

## 2021-03-07 DIAGNOSIS — F321 Major depressive disorder, single episode, moderate: Secondary | ICD-10-CM

## 2021-03-07 DIAGNOSIS — F411 Generalized anxiety disorder: Secondary | ICD-10-CM

## 2021-05-31 ENCOUNTER — Telehealth: Payer: Self-pay | Admitting: Family Medicine

## 2021-05-31 NOTE — Telephone Encounter (Signed)
Pt call and stated he want dr.Banks to call in the cold sore medication that she gave him in 22 to  ?[id number] [place], [place] - [address] Phone:  [phone]  ?Fax:  [phone]  ?  ? ?

## 2021-07-02 ENCOUNTER — Other Ambulatory Visit: Payer: Self-pay | Admitting: Family Medicine

## 2021-07-02 DIAGNOSIS — F411 Generalized anxiety disorder: Secondary | ICD-10-CM

## 2021-07-02 DIAGNOSIS — F321 Major depressive disorder, single episode, moderate: Secondary | ICD-10-CM

## 2021-07-29 ENCOUNTER — Other Ambulatory Visit: Payer: Self-pay | Admitting: Family Medicine

## 2021-07-29 DIAGNOSIS — F411 Generalized anxiety disorder: Secondary | ICD-10-CM

## 2021-07-29 DIAGNOSIS — F321 Major depressive disorder, single episode, moderate: Secondary | ICD-10-CM

## 2021-09-25 ENCOUNTER — Ambulatory Visit: Payer: 59 | Admitting: Family Medicine

## 2021-09-25 ENCOUNTER — Encounter: Payer: Self-pay | Admitting: Family Medicine

## 2021-09-25 VITALS — BP 124/86 | HR 81 | Temp 98.7°F | Wt 223.6 lb

## 2021-09-25 DIAGNOSIS — F321 Major depressive disorder, single episode, moderate: Secondary | ICD-10-CM

## 2021-09-25 DIAGNOSIS — B001 Herpesviral vesicular dermatitis: Secondary | ICD-10-CM

## 2021-09-25 DIAGNOSIS — F411 Generalized anxiety disorder: Secondary | ICD-10-CM | POA: Diagnosis not present

## 2021-09-25 DIAGNOSIS — Z23 Encounter for immunization: Secondary | ICD-10-CM | POA: Diagnosis not present

## 2021-09-25 DIAGNOSIS — R234 Changes in skin texture: Secondary | ICD-10-CM | POA: Diagnosis not present

## 2021-09-25 DIAGNOSIS — L608 Other nail disorders: Secondary | ICD-10-CM | POA: Diagnosis not present

## 2021-09-25 MED ORDER — SERTRALINE HCL 50 MG PO TABS: ORAL_TABLET | ORAL | 1 refills | Status: DC

## 2021-09-25 MED ORDER — FAMCICLOVIR 500 MG PO TABS: ORAL_TABLET | ORAL | 1 refills | Status: DC

## 2021-09-25 NOTE — Progress Notes (Signed)
Subjective:    Patient ID: Jimmy Russo, male    DOB: 11-07-1990, 31 y.o.   MRN: [id number]  Chief Complaint  Patient presents with   marks    Noticed black marks on hands about 2 wks ago, have not went away or gotten any better. Has not tried anything. Spot on right hand, line on left hand under nail.    HPI Patient was seen today for several ongoing concerns.  Patient endorses a black spot on the right hand x 5 days.  Area is not painful, pruritic.  Denies injury.  Few days later patient noticed a black line underneath fingernail of left thumb.  Patient inquires about medication for cold sores.  States they have been really bad lately.  Currently has a cold sore on lower lip x several days.  Pt states current dose of Zoloft is working well.  He is hopeful that it would be not to help with the fall and winter months.  Past Medical History:  Diagnosis Date   Bicuspid aortic valve     No Known Allergies  ROS General: Denies fever, chills, night sweats, changes in weight, changes in appetite HEENT: Denies headaches, ear pain, changes in vision, rhinorrhea, sore throat CV: Denies CP, palpitations, SOB, orthopnea Pulm: Denies SOB, cough, wheezing GI: Denies abdominal pain, nausea, vomiting, diarrhea, constipation GU: Denies dysuria, hematuria, frequency Msk: Denies muscle cramps, joint pains Neuro: Denies weakness, numbness, tingling  Skin: Denies rashes, bruising  +line underneath L fingernail, dark mark in palm of hand, cold sore Psych: Denies depression, anxiety, hallucinations  +anxiety, depression     Objective:    Blood pressure 124/86, pulse 81, temperature 98.7 F (37.1 C), temperature source Oral, weight 223 lb 9.6 oz (101.4 kg), SpO2 98 %.  Gen. Pleasant, well-nourished, in no distress, normal affect   HEENT: Velva/AT, face symmetric, conjunctiva clear, no scleral icterus, PERRLA, EOMI, nares patent without drainage Lungs: no accessory muscle use, CTAB, no  wheezes or rales Cardiovascular: RRR, no m/r/g, no peripheral edema Musculoskeletal: No deformities, no cyanosis or clubbing, normal tone Neuro:  A&Ox3, CN II-XII intact, normal gait Skin:  Warm, no lesions/ rash.  Splinter hemorrhage left lateral thumbnail.  Splinterlike area of hemorrhage in palm of right hand.  Cold sore middle of lower lip.   Wt Readings from Last 3 Encounters:  09/25/21 223 lb 9.6 oz (101.4 kg)  01/31/21 209 lb (94.8 kg)  04/25/20 208 lb 3.2 oz (94.4 kg)    Lab Results  Component Value Date   WBC 4.9 01/31/2021   HGB 14.4 01/31/2021   HCT 44.0 01/31/2021   PLT 227.0 01/31/2021   GLUCOSE 59 (L) 01/31/2021   CHOL 166 01/31/2021   TRIG 79.0 01/31/2021   HDL 44.00 01/31/2021   LDLCALC 107 (H) 01/31/2021   ALT 13 03/27/2017   AST 13 03/27/2017   NA 140 01/31/2021   K 4.2 01/31/2021   CL 104 01/31/2021   CREATININE 1.00 01/31/2021   BUN 16 01/31/2021   CO2 31 01/31/2021   TSH 1.01 01/31/2021   HGBA1C 5.3 01/31/2021      09/25/2021    1:36 PM 01/31/2021    9:04 AM 04/25/2020    9:24 AM  Depression screen PHQ 2/9  Decreased Interest 0 1 0  Down, Depressed, Hopeless 0 2 0  PHQ - 2 Score 0 3 0  Altered sleeping 0 2 0  Tired, decreased energy 1 1 1   Change in appetite 0 1 0  Feeling bad or failure about yourself  0 3 1  Trouble concentrating 1 0 0  Moving slowly or fidgety/restless 0 0 1  Suicidal thoughts 0 0 0  PHQ-9 Score 2 10 3   Difficult doing work/chores Not difficult at all  Not difficult at all   Assessment/Plan:  Herpes labialis  - Plan: famciclovir (FAMVIR) 500 MG tablet  Splinter hemorrhage of fingernail -continue to monitor  Skin eschar -healing cut in palm of R hand -continue to monitor  GAD (generalized anxiety disorder)  -stable - Plan: sertraline (ZOLOFT) 50 MG tablet  Depression, major, single episode, moderate (HCC) -stable -PHQ 9 score 8 this visit, previously 16 -Continue Zoloft 50 mg daily -Continue self-care  -  Plan: sertraline (ZOLOFT) 50 MG tablet  Need for influenza vaccination  - Plan: Flu Vaccine QUAD 6+ mos PF IM (Fluarix Quad PF)  F/u in 3-6 months, sooner if needed  , MD

## 2022-04-24 ENCOUNTER — Other Ambulatory Visit: Payer: Self-pay | Admitting: Family Medicine

## 2022-04-24 DIAGNOSIS — F411 Generalized anxiety disorder: Secondary | ICD-10-CM

## 2022-04-24 DIAGNOSIS — F321 Major depressive disorder, single episode, moderate: Secondary | ICD-10-CM

## 2022-04-24 NOTE — Telephone Encounter (Signed)
Pt called to say he is completely out of this medication and he is feeling very anxious.  Please refill as soon as possible.

## 2022-04-24 NOTE — Telephone Encounter (Signed)
Spoke to pt and scheduled a follow up appt on 05/14/2022.

## 2022-05-14 ENCOUNTER — Ambulatory Visit: Payer: 59 | Admitting: Family Medicine

## 2022-05-19 ENCOUNTER — Ambulatory Visit: Payer: 59 | Admitting: Family Medicine

## 2022-05-22 ENCOUNTER — Ambulatory Visit (INDEPENDENT_AMBULATORY_CARE_PROVIDER_SITE_OTHER): Payer: 59 | Admitting: Family Medicine

## 2022-05-22 ENCOUNTER — Encounter: Payer: Self-pay | Admitting: Family Medicine

## 2022-05-22 VITALS — BP 122/84 | HR 91 | Temp 98.7°F | Wt 237.0 lb

## 2022-05-22 DIAGNOSIS — F320 Major depressive disorder, single episode, mild: Secondary | ICD-10-CM

## 2022-05-22 DIAGNOSIS — F411 Generalized anxiety disorder: Secondary | ICD-10-CM

## 2022-05-22 MED ORDER — SERTRALINE HCL 20 MG/ML PO CONC: 40.0000 mg | Freq: Every day | ORAL | 3 refills | Status: DC

## 2022-05-22 NOTE — Progress Notes (Signed)
Established Patient Office Visit   Subjective  Patient ID: Jimmy Russo, male    DOB: 1990-07-13  Age: 32 y.o. MRN: [id number]  Chief Complaint  Patient presents with   Medication Refill    Patient is a 32 year old male with pmh sig for anxiety, depression, herpes labialis, aortic dilation, bicuspid valve who presents for follow-up.  Patient states Jimmy Russo is doing well overall.  Taking Zoloft 50 mg daily.  Interested in weaning medication.  Prior attempt to wean to 25 mg caused withdrawal symptoms.  No longer using hydroxyzine.  Medication Refill      ROS Negative unless stated above    Objective:     BP 122/84 (BP Location: Right Arm, Patient Position: Sitting, Cuff Size: Normal)   Pulse 91   Temp 98.7 F (37.1 C) (Oral)   Wt 237 lb (107.5 kg)   SpO2 97%   BMI 35.00 kg/m    Physical Exam Constitutional:      General: Jimmy Russo is not in acute distress.    Appearance: Normal appearance.  HENT:     Head: Normocephalic and atraumatic.     Nose: Nose normal.     Mouth/Throat:     Mouth: Mucous membranes are moist.  Cardiovascular:     Rate and Rhythm: Normal rate and regular rhythm.     Heart sounds: Normal heart sounds. No murmur heard.    No gallop.  Pulmonary:     Effort: Pulmonary effort is normal. No respiratory distress.     Breath sounds: Normal breath sounds. No wheezing, rhonchi or rales.  Skin:    General: Skin is warm and dry.  Neurological:     Mental Status: Jimmy Russo is alert and oriented to person, place, and time.      No results found for any visits on 05/22/22.     05/22/2022    4:42 PM 09/25/2021    1:36 PM 01/31/2021    9:04 AM  Depression screen PHQ 2/9  Decreased Interest 0 0 1  Down, Depressed, Hopeless 1 0 2  PHQ - 2 Score 1 0 3  Altered sleeping 0 0 2  Tired, decreased energy 1 1 1   Change in appetite 0 0 1  Feeling bad or failure about yourself  2 0 3  Trouble concentrating 1 1 0  Moving slowly or fidgety/restless 0 0 0  Suicidal  thoughts 0 0 0  PHQ-9 Score 5 2 10   Difficult doing work/chores Somewhat difficult Not difficult at all        05/22/2022    4:42 PM 04/25/2020    9:25 AM 02/22/2020    9:47 AM 07/22/2019    4:21 PM  GAD 7 : Generalized Anxiety Score  Nervous, Anxious, on Edge 2 2 3 3   Control/stop worrying 2 1 3 2   Worry too much - different things 2 1 3 2   Trouble relaxing 1 1 3 1   Restless 0 1 1 0  Easily annoyed or irritable 1 0 0 1  Afraid - awful might happen 3 2 3 2   Total GAD 7 Score 11 8 16 11   Anxiety Difficulty Somewhat difficult Not difficult at all Very difficult Somewhat difficult       Assessment & Plan:  Depression, major, single episode, mild (HCC) -     Sertraline HCl; Take 2 mLs (40 mg total) by mouth daily.  Dispense: 60 mL; Refill: 3  Depression stable.  Start slow taper off Zoloft 50 mg daily.  As  decreasing dose to 25 mg caused symptoms in the past we will use liquid Zoloft to decrease by 10 mg every 3 months.  Prescription sent to pharmacy.  Given precautions.  Return in about 3 months (around 08/22/2022).   Deeann Saint, MD

## 2022-05-22 NOTE — Patient Instructions (Signed)
I sent in a prescription for Zoloft solution.  You can take 40 mg daily for the next few months and see how things go.  If things are okay we can continue decreasing the dose slowly.

## 2022-06-16 ENCOUNTER — Other Ambulatory Visit: Payer: Self-pay | Admitting: Family Medicine

## 2022-06-16 DIAGNOSIS — F320 Major depressive disorder, single episode, mild: Secondary | ICD-10-CM

## 2022-06-18 ENCOUNTER — Other Ambulatory Visit: Payer: Self-pay | Admitting: Family Medicine

## 2022-06-18 DIAGNOSIS — F320 Major depressive disorder, single episode, mild: Secondary | ICD-10-CM

## 2022-07-27 ENCOUNTER — Other Ambulatory Visit: Payer: Self-pay | Admitting: Family Medicine

## 2022-07-27 DIAGNOSIS — F320 Major depressive disorder, single episode, mild: Secondary | ICD-10-CM

## 2022-07-31 ENCOUNTER — Other Ambulatory Visit: Payer: Self-pay | Admitting: Family Medicine

## 2022-07-31 DIAGNOSIS — F320 Major depressive disorder, single episode, mild: Secondary | ICD-10-CM

## 2022-11-13 ENCOUNTER — Other Ambulatory Visit: Payer: Self-pay | Admitting: Family Medicine

## 2022-11-13 DIAGNOSIS — F320 Major depressive disorder, single episode, mild: Secondary | ICD-10-CM

## 2023-01-05 ENCOUNTER — Ambulatory Visit (INDEPENDENT_AMBULATORY_CARE_PROVIDER_SITE_OTHER): Payer: 59 | Admitting: Family Medicine

## 2023-01-05 ENCOUNTER — Encounter: Payer: Self-pay | Admitting: Family Medicine

## 2023-01-05 VITALS — BP 140/80 | HR 78 | Temp 98.0°F | Ht 69.0 in | Wt 231.2 lb

## 2023-01-05 DIAGNOSIS — Z0000 Encounter for general adult medical examination without abnormal findings: Secondary | ICD-10-CM

## 2023-01-05 DIAGNOSIS — F320 Major depressive disorder, single episode, mild: Secondary | ICD-10-CM | POA: Diagnosis not present

## 2023-01-05 DIAGNOSIS — E7841 Elevated Lipoprotein(a): Secondary | ICD-10-CM | POA: Diagnosis not present

## 2023-01-05 DIAGNOSIS — Q2381 Bicuspid aortic valve: Secondary | ICD-10-CM

## 2023-01-05 DIAGNOSIS — R42 Dizziness and giddiness: Secondary | ICD-10-CM | POA: Diagnosis not present

## 2023-01-05 DIAGNOSIS — Z0001 Encounter for general adult medical examination with abnormal findings: Secondary | ICD-10-CM

## 2023-01-05 DIAGNOSIS — Z131 Encounter for screening for diabetes mellitus: Secondary | ICD-10-CM | POA: Diagnosis not present

## 2023-01-05 DIAGNOSIS — F411 Generalized anxiety disorder: Secondary | ICD-10-CM

## 2023-01-05 DIAGNOSIS — I77810 Thoracic aortic ectasia: Secondary | ICD-10-CM

## 2023-01-05 LAB — COMPREHENSIVE METABOLIC PANEL
ALT: 13 U/L (ref 0–53)
AST: 14 U/L (ref 0–37)
Albumin: 4.5 g/dL (ref 3.5–5.2)
Alkaline Phosphatase: 62 U/L (ref 39–117)
BUN: 15 mg/dL (ref 6–23)
CO2: 27 meq/L (ref 19–32)
Calcium: 9 mg/dL (ref 8.4–10.5)
Chloride: 101 meq/L (ref 96–112)
Creatinine, Ser: 0.98 mg/dL (ref 0.40–1.50)
GFR: 102.13 mL/min (ref >60.00)
Glucose, Bld: 85 mg/dL (ref 70–99)
Potassium: 4.3 meq/L (ref 3.5–5.1)
Sodium: 136 meq/L (ref 135–145)
Total Bilirubin: 0.9 mg/dL (ref 0.2–1.2)
Total Protein: 7.2 g/dL (ref 6.0–8.3)

## 2023-01-05 LAB — CBC WITH DIFFERENTIAL/PLATELET
Basophils Absolute: 0 10*3/uL (ref 0.0–0.1)
Basophils Relative: 0.8 % (ref 0.0–3.0)
Eosinophils Absolute: 0.1 10*3/uL (ref 0.0–0.7)
Eosinophils Relative: 0.9 % (ref 0.0–5.0)
HCT: 42.8 % (ref 39.0–52.0)
Hemoglobin: 14.6 g/dL (ref 13.0–17.0)
Lymphocytes Relative: 32.7 % (ref 12.0–46.0)
Lymphs Abs: 1.8 10*3/uL (ref 0.7–4.0)
MCHC: 34.1 g/dL (ref 30.0–36.0)
MCV: 88.4 fL (ref 78.0–100.0)
Monocytes Absolute: 0.5 10*3/uL (ref 0.1–1.0)
Monocytes Relative: 9 % (ref 3.0–12.0)
Neutro Abs: 3.1 10*3/uL (ref 1.4–7.7)
Neutrophils Relative %: 56.6 % (ref 43.0–77.0)
Platelets: 257 10*3/uL (ref 150.0–400.0)
RBC: 4.84 Mil/uL (ref 4.22–5.81)
RDW: 12.9 % (ref 11.5–15.5)
WBC: 5.5 10*3/uL (ref 4.0–10.5)

## 2023-01-05 LAB — LIPID PANEL
Cholesterol: 225 mg/dL — ABNORMAL HIGH (ref 0–200)
HDL: 34.3 mg/dL — ABNORMAL LOW (ref >39.00)
LDL Cholesterol: 155 mg/dL — ABNORMAL HIGH (ref 0–99)
NonHDL: 190.78
Total CHOL/HDL Ratio: 7
Triglycerides: 180 mg/dL — ABNORMAL HIGH (ref 0.0–149.0)
VLDL: 36 mg/dL (ref 0.0–40.0)

## 2023-01-05 LAB — TSH: TSH: 1.37 u[IU]/mL (ref 0.35–5.50)

## 2023-01-05 LAB — T4, FREE: Free T4: 0.91 ng/dL (ref 0.60–1.60)

## 2023-01-05 LAB — HEMOGLOBIN A1C: Hgb A1c MFr Bld: 5.4 % (ref 4.6–6.5)

## 2023-01-05 MED ORDER — SERTRALINE HCL 20 MG/ML PO CONC: 40.0000 mg | Freq: Every day | ORAL | 1 refills | Status: DC

## 2023-01-05 NOTE — Patient Instructions (Addendum)
A refill on sertraline solution was sent to your pharmacy.  The prescription says take 40 mg daily however if you would like to start weaning down you can take 35 mg daily (1.8 mL) for 4 weeks then continue decreasing monthly.  30 mg (1.5 mL), 25 mg (1.3 mL), 20 mg (1 mL).  We will have you follow-up in person or virtually in the next few months to see how things are going with the wean.  Orthostatic blood pressure readings were normal.  Given her dizziness we can have you follow-up with cardiology to make sure the aortic valve stenosis has not become worse.

## 2023-01-05 NOTE — Progress Notes (Signed)
Established Patient Office Visit   Subjective  Patient ID: Jimmy Russo, male    DOB: 1990-08-24  Age: 32 y.o. MRN: [id number]  Chief Complaint  Patient presents with   Annual Exam    Patient is a 32 year old male seen for CPE.  Patient states he is doing well overall.  Working on Social research officer, government Zoloft currently taking 40 mg daily of the liquid.  Requesting refill.  Patient notes intermittent episodes of dizziness with change in position that may last a few minutes randomly throughout the day.  Concern as a few episodes have caused nausea.  Cannot recall any similarities amongst episodes such as dehydration, hunger, etc.    Patient Active Problem List   Diagnosis Date Noted   Aortic root dilatation (HCC) 07/14/2019   Aortic dilatation (HCC) 05/12/2018   Bicuspid aortic valve 05/12/2018   Aortic valve stenosis 05/12/2018   Educated about COVID-19 virus infection 05/12/2018   Herpes labialis 07/28/2017   Past Medical History:  Diagnosis Date   Bicuspid aortic valve    Past Surgical History:  Procedure Laterality Date   HYDROCELE EXCISION     Social History   Tobacco Use   Smoking status: Former   Smokeless tobacco: Former    Types: Associate Professor status: Former  Substance Use Topics   Alcohol use: Yes    Comment: ocass   Drug use: No   Family History  Problem Relation Age of Onset   Hyperlipidemia Father    Colon cancer Neg Hx    Esophageal cancer Neg Hx    No Known Allergies  ROS Negative unless stated above    Objective:     BP (!) 140/80 (BP Location: Left Arm, Patient Position: Sitting, Cuff Size: Large)   Pulse 78   Temp 98 F (36.7 C) (Oral)   Ht 5\' 9"  (1.753 m)   Wt 231 lb 3.2 oz (104.9 kg)   SpO2 97%   BMI 34.14 kg/m  BP Readings from Last 3 Encounters:  01/05/23 (!) 140/80  05/22/22 122/84  09/25/21 124/86   Wt Readings from Last 3 Encounters:  01/05/23 231 lb 3.2 oz (104.9 kg)  05/22/22 237 lb (107.5 kg)  09/25/21 223  lb 9.6 oz (101.4 kg)      Physical Exam Constitutional:      Appearance: Normal appearance.  HENT:     Head: Normocephalic and atraumatic.     Right Ear: Tympanic membrane, ear canal and external ear normal.     Left Ear: Tympanic membrane, ear canal and external ear normal.     Nose: Nose normal.     Mouth/Throat:     Mouth: Mucous membranes are moist.     Pharynx: No oropharyngeal exudate or posterior oropharyngeal erythema.  Eyes:     General: No scleral icterus.    Extraocular Movements: Extraocular movements intact.     Conjunctiva/sclera: Conjunctivae normal.     Pupils: Pupils are equal, round, and reactive to light.  Neck:     Thyroid: No thyromegaly.  Cardiovascular:     Rate and Rhythm: Normal rate and regular rhythm.     Pulses: Normal pulses.     Heart sounds: Normal heart sounds. No murmur heard.    No friction rub.     Comments: Snap heard. Pulmonary:     Effort: Pulmonary effort is normal.     Breath sounds: Normal breath sounds. No wheezing, rhonchi or rales.  Abdominal:     General:  Bowel sounds are normal.     Palpations: Abdomen is soft.     Tenderness: There is no abdominal tenderness.  Musculoskeletal:        General: No deformity. Normal range of motion.  Lymphadenopathy:     Cervical: No cervical adenopathy.  Skin:    General: Skin is warm and dry.     Findings: No lesion.  Neurological:     General: No focal deficit present.     Mental Status: He is alert and oriented to person, place, and time.  Psychiatric:        Mood and Affect: Mood normal.        Thought Content: Thought content normal.        01/05/2023    2:13 PM 05/22/2022    4:42 PM 09/25/2021    1:36 PM  Depression screen PHQ 2/9  Decreased Interest 0 0 0  Down, Depressed, Hopeless 1 1 0  PHQ - 2 Score 1 1 0  Altered sleeping 0 0 0  Tired, decreased energy 1 1 1   Change in appetite 0 0 0  Feeling bad or failure about yourself  0 2 0  Trouble concentrating 1 1 1   Moving  slowly or fidgety/restless 0 0 0  Suicidal thoughts  0 0  PHQ-9 Score 3 5 2   Difficult doing work/chores Not difficult at all Somewhat difficult Not difficult at all      01/05/2023    2:14 PM 05/22/2022    4:42 PM 04/25/2020    9:25 AM 02/22/2020    9:47 AM  GAD 7 : Generalized Anxiety Score  Nervous, Anxious, on Edge 1 2 2 3   Control/stop worrying 2 2 1 3   Worry too much - different things 2 2 1 3   Trouble relaxing 1 1 1 3   Restless 0 0 1 1  Easily annoyed or irritable 1 1 0 0  Afraid - awful might happen 3 3 2 3   Total GAD 7 Score 10 11 8 16   Anxiety Difficulty Not difficult at all Somewhat difficult Not difficult at all Very difficult     No results found for any visits on 01/05/23.    Assessment & Plan:  Well adult exam -Age-appropriate health screenings discussed -Obtain labs -Immunizations reviewed -Next CPE in 1 year -     CBC with Differential/Platelet; Future -     Comprehensive metabolic panel; Future -     Hemoglobin A1c; Future -     Lipid panel; Future -     TSH; Future  Elevated lipoprotein(a) -     Lipid panel; Future  Dizziness -Discussed possible causes including hypo or hyperglycemia, dehydration, medication withdrawal.  Also consider history of aortic valve stenosis and mild aortic dilation. -Orthostatic BP readings normal -Will obtain labs -Will have patient follow-up with cardiology, repeat echo -     CBC with Differential/Platelet; Future -     Comprehensive metabolic panel; Future -     Hemoglobin A1c; Future -     TSH; Future -     T4, free; Future  GAD (generalized anxiety disorder) -GAD-7 score 10 this visit -     TSH; Future -     Sertraline HCl; Take 2 mLs (40 mg total) by mouth daily.  Dispense: 180 mL; Refill: 1  Depression, major, single episode, mild (HCC) -PHQ-9 score 3 this visit -Will continue weaning off sertraline.  Patient can start taking 35 mg (1.8 mL) daily x 4 weeks and continue weaning  monthly by 5 mg.  Okay to stay at  a dose longer than 4 weeks if needed. -Will have patient follow-up in the next 3-4 months, sooner if needed -     TSH; Future -     Sertraline HCl; Take 2 mLs (40 mg total) by mouth daily.  Dispense: 180 mL; Refill: 1  Bicuspid aortic valve       -      Referral to Cardiology  Aortic root dilation (HCC)       -      Referral to cardiology Return in about 4 months (around 05/06/2023).   Deeann Saint, MD

## 2023-04-08 NOTE — Progress Notes (Unsigned)
 Cardiology Office Note   Date:  04/10/2023   ID:  Jimmy Russo, DOB 25-Jul-1990, MRN [id number]  PCP:  Deeann Saint, MD  Cardiologist:   Rollene Rotunda, MD Referring:  Deeann Saint, MD  Chief Complaint  Patient presents with   Bicuspid aortic valve      History of Present Illness: Jimmy Russo is a 33 y.o. male who presents for who presents for evaluation of bicuspid aortic valve.  The patient says he is known about this since he was born. He was in [place].  He had an echo in 2018 with a bicuspid valve and mild AS.  He had mild aortic dilatation of the aortic root on CT.  There was no evidence of coarctation.   I saw him last in 2021.  He had no stenosis on TTE.    He presents for follow up.  Since I saw him he has had no new cardiovascular complaints.  He is starting to do a little bit of exercising and walking as well as weights. The patient denies any new symptoms such as chest discomfort, neck or arm discomfort. There has been no new shortness of breath, PND or orthopnea. There have been no reported palpitations, presyncope or syncope.   Past Medical History:  Diagnosis Date   Bicuspid aortic valve     Past Surgical History:  Procedure Laterality Date   HYDROCELE EXCISION       Current Outpatient Medications  Medication Sig Dispense Refill   sertraline (ZOLOFT) 20 MG/ML concentrated solution Take 2 mLs (40 mg total) by mouth daily. 180 mL 1   No current facility-administered medications for this visit.    Allergies:   Patient has no known allergies.    Social History:  The patient  reports that he has quit smoking. He has quit using smokeless tobacco.  His smokeless tobacco use included chew. He reports current alcohol use. He reports that he does not use drugs.   Family History:  The patient's family history includes Hyperlipidemia in his father.    ROS:  Please see the history of present illness.   Otherwise, review of systems are  positive for none.   All other systems are reviewed and negative.    PHYSICAL EXAM: VS:  BP 120/86 (BP Location: Left Arm, Patient Position: Sitting)   Pulse 64   Ht 5\' 9"  (1.753 m)   Wt 231 lb (104.8 kg)   SpO2 98%   BMI 34.11 kg/m  , BMI Body mass index is 34.11 kg/m. GENERAL:  Well appearing HEENT:  Pupils equal round and reactive, fundi not visualized, oral mucosa unremarkable NECK:  No jugular venous distention, waveform within normal limits, carotid upstroke brisk and symmetric, no bruits, no thyromegaly LYMPHATICS:  No cervical, inguinal adenopathy LUNGS:  Clear to auscultation bilaterally BACK:  No CVA tenderness CHEST:  Unremarkable HEART:  PMI not displaced or sustained,S1 and S2 within normal limits, no S3, no S4, no clicks, no rubs, very brief apical short nonradiating systolic murmur, no diastolic murmurs, positive opening snap ABD:  Flat, positive bowel sounds normal in frequency in pitch, no bruits, no rebound, no guarding, no midline pulsatile mass, no hepatomegaly, no splenomegaly EXT:  2 plus pulses throughout, no edema, no cyanosis no clubbing SKIN:  No rashes no nodules NEURO:  Cranial nerves II through XII grossly intact, motor grossly intact throughout PSYCH:  Cognitively intact, oriented to person place and time    EKG:  EKG Interpretation  Date/Time:  Friday April 10 2023 08:23:46 EDT Ventricular Rate:  64 PR Interval:  178 QRS Duration:  108 QT Interval:  390 QTC Calculation: 402 R Axis:   55  Text Interpretation: Normal sinus rhythm RSR' or QR pattern in V1 suggests right ventricular conduction delay No significant change since last tracing Confirmed by Rollene Rotunda ([id number]) on 04/10/2023 8:26:33 AM     Recent Labs: 01/05/2023: ALT 13; BUN 15; Creatinine, Ser 0.98; Hemoglobin 14.6; Platelets 257.0; Potassium 4.3; Sodium 136; TSH 1.37    Lipid Panel    Component Value Date/Time   CHOL 225 (H) 01/05/2023 1455   TRIG 180.0 (H) 01/05/2023 1455    HDL 34.30 (L) 01/05/2023 1455   CHOLHDL 7 01/05/2023 1455   VLDL 36.0 01/05/2023 1455   LDLCALC 155 (H) 01/05/2023 1455      Wt Readings from Last 3 Encounters:  04/10/23 231 lb (104.8 kg)  01/05/23 231 lb 3.2 oz (104.9 kg)  05/22/22 237 lb (107.5 kg)      Other studies Reviewed: Additional studies/ records that were reviewed today include: Labs. Review of the above records demonstrates:  Please see elsewhere in the note.     ASSESSMENT AND PLAN:  Bicuspid aortic valve: I will check an echocardiogram next year as it would have been 5 years.  He has no symptoms and his exam is unchanged.  He did have a mild gradient in 2021.  We talked about the physiology again today.  Dyslipidemia: His LDL is 155.  I do not think he needs medical management but he does need diet control and I suggested a Mediterranean plant forward diet.   Current medicines are reviewed at length with the patient today.  The patient does not have concerns regarding medicines.  The following changes have been made:  no change  Labs/ tests ordered today include:   Orders Placed This Encounter  Procedures   EKG 12-Lead   ECHOCARDIOGRAM COMPLETE     Disposition:   FU with me in one year after a follow up echo.     Signed, Rollene Rotunda, MD  04/10/2023 9:08 AM    [facility]

## 2023-04-10 ENCOUNTER — Encounter: Payer: Self-pay | Admitting: Cardiology

## 2023-04-10 ENCOUNTER — Ambulatory Visit: Payer: 59 | Attending: Cardiology | Admitting: Cardiology

## 2023-04-10 VITALS — BP 120/86 | HR 64 | Ht 69.0 in | Wt 231.0 lb

## 2023-04-10 DIAGNOSIS — Q2381 Bicuspid aortic valve: Secondary | ICD-10-CM

## 2023-04-10 NOTE — Patient Instructions (Signed)
 Medication Instructions:  No changes.  *If you need a refill on your cardiac medications before your next appointment, please call your pharmacy*  Testing/Procedures: Your physician has requested that you have an echocardiogram.Due in February 2026. Echocardiography is a painless test that uses sound waves to create images of your heart. It provides your doctor with information about the size and shape of your heart and how well your heart's chambers and valves are working. This procedure takes approximately one hour. There are no restrictions for this procedure. Please do NOT wear cologne, perfume, aftershave, or lotions (deodorant is allowed). Please arrive 15 minutes prior to your appointment time.  Please note: We ask at that you not bring children with you during ultrasound (echo/ vascular) testing. Due to room size and safety concerns, children are not allowed in the ultrasound rooms during exams. Our front office staff cannot provide observation of children in our lobby area while testing is being conducted. An adult accompanying a patient to their appointment will only be allowed in the ultrasound room at the discretion of the ultrasound technician under special circumstances. We apologize for any inconvenience.    Follow-Up: At [facility], you and your health needs are our priority.  As part of our continuing mission to provide you with exceptional heart care, we have created designated Provider Care Teams.  These Care Teams include your primary Cardiologist (physician) and Advanced Practice Providers (APPs -  Physician Assistants and Nurse Practitioners) who all work together to provide you with the care you need, when you need it.  We recommend signing up for the patient portal called "MyChart".  Sign up information is provided on this After Visit Summary.  MyChart is used to connect with patients for Virtual Visits (Telemedicine).  Patients are able to view lab/test results,  encounter notes, upcoming appointments, etc.  Non-urgent messages can be sent to your provider as well.   To learn more about what you can do with MyChart, go to [email or web address].    Your next appointment:   1 year(s)  Provider:   Rollene Rotunda, MD     Other Instructions

## 2023-07-12 ENCOUNTER — Other Ambulatory Visit: Payer: Self-pay | Admitting: Family Medicine

## 2023-07-12 DIAGNOSIS — F320 Major depressive disorder, single episode, mild: Secondary | ICD-10-CM

## 2023-07-12 DIAGNOSIS — F411 Generalized anxiety disorder: Secondary | ICD-10-CM

## 2023-07-14 NOTE — Telephone Encounter (Signed)
 Called patient left a VM to return call, per last OV note Return in about 4 months (around 05/06/2023). Patient would need to schedule appt

## 2023-08-05 ENCOUNTER — Ambulatory Visit: Admitting: Family Medicine

## 2023-08-05 VITALS — BP 118/84 | HR 102 | Temp 97.7°F | Ht 69.0 in | Wt 234.8 lb

## 2023-08-05 DIAGNOSIS — R519 Headache, unspecified: Secondary | ICD-10-CM

## 2023-08-05 DIAGNOSIS — F320 Major depressive disorder, single episode, mild: Secondary | ICD-10-CM

## 2023-08-05 DIAGNOSIS — Z23 Encounter for immunization: Secondary | ICD-10-CM

## 2023-08-05 DIAGNOSIS — E66811 Obesity, class 1: Secondary | ICD-10-CM | POA: Diagnosis not present

## 2023-08-05 DIAGNOSIS — F411 Generalized anxiety disorder: Secondary | ICD-10-CM

## 2023-08-05 DIAGNOSIS — Z6834 Body mass index (BMI) 34.0-34.9, adult: Secondary | ICD-10-CM

## 2023-08-05 MED ORDER — SERTRALINE HCL 20 MG/ML PO CONC: 30.0000 mg | Freq: Every day | ORAL | 1 refills | Status: DC

## 2023-08-05 NOTE — Progress Notes (Signed)
 Established Patient Office Visit   Subjective  Patient ID: Jimmy Russo, male    DOB: 22-Jan-1990  Age: 33 y.o. MRN: [id number]  Chief Complaint  Patient presents with   Medical Management of Chronic Issues    Left side Jaw/ neck pain started a week ago, rate of pain 4 out of 10, doing better,  And medication refill     Patient is a 33 year old male seen for ongoing concern and follow-up.  Patient states his left jaw/neck began hurting 2 weeks ago.  Sensation noted as a 4/10 pain that started after clenching teeth while driving in the rain.  Symptoms have seemed to improve this week.  Patient did not take anything for symptoms.  Denies hearing popping in the ear, ringing in the ear, pain in the ear, or throat pain.  Not currently taking allergy medication.  Patient states mood, energy, sleep are good.  Taking Zoloft  liquid 20 mg/ml in an attempt to wean medication.  Currently taking about 1.75 mL daily. Has noticed wt gain.  Exercising a few times per week.  Pt inquires if any vaccines are needed.    Patient Active Problem List   Diagnosis Date Noted   Aortic root dilatation (HCC) 07/14/2019   Aortic dilatation (HCC) 05/12/2018   Bicuspid aortic valve 05/12/2018   Aortic valve stenosis 05/12/2018   Educated about COVID-19 virus infection 05/12/2018   Herpes labialis 07/28/2017   Past Medical History:  Diagnosis Date   Bicuspid aortic valve    GERD (gastroesophageal reflux disease)    Past Surgical History:  Procedure Laterality Date   HYDROCELE EXCISION     Social History   Tobacco Use   Smoking status: Former   Smokeless tobacco: Former    Types: Associate Professor status: Former  Substance Use Topics   Alcohol use: Yes    Comment: ocass   Drug use: No   Family History  Problem Relation Age of Onset   Hyperlipidemia Father    Colon cancer Neg Hx    Esophageal cancer Neg Hx    No Known Allergies  ROS Negative unless stated above     Objective:     BP 118/84 (BP Location: Left Arm, Patient Position: Sitting, Cuff Size: Normal)   Pulse (!) 102   Temp 97.7 F (36.5 C) (Oral)   Ht 5' 9 (1.753 m)   Wt 234 lb 12.8 oz (106.5 kg)   SpO2 97%   BMI 34.67 kg/m  BP Readings from Last 3 Encounters:  08/05/23 118/84  04/10/23 120/86  01/05/23 (!) 140/80   Wt Readings from Last 3 Encounters:  08/05/23 234 lb 12.8 oz (106.5 kg)  04/10/23 231 lb (104.8 kg)  01/05/23 231 lb 3.2 oz (104.9 kg)      Physical Exam Constitutional:      General: He is not in acute distress.    Appearance: Normal appearance.  HENT:     Head: Normocephalic and atraumatic.     Right Ear: Hearing, tympanic membrane, ear canal and external ear normal.     Left Ear: Hearing, tympanic membrane, ear canal and external ear normal.     Ears:     Comments: No pain with movement of External ear.     Nose: Nose normal.     Mouth/Throat:     Mouth: Mucous membranes are moist.  Cardiovascular:     Rate and Rhythm: Normal rate and regular rhythm.     Heart sounds:  Normal heart sounds. No murmur heard.    No gallop.  Pulmonary:     Effort: Pulmonary effort is normal.     Breath sounds: Normal breath sounds.  Musculoskeletal:     Cervical back: No tenderness.  Lymphadenopathy:     Cervical: No cervical adenopathy.  Skin:    General: Skin is warm and dry.  Neurological:     Mental Status: He is alert and oriented to person, place, and time.        01/05/2023    2:13 PM 05/22/2022    4:42 PM 09/25/2021    1:36 PM  Depression screen PHQ 2/9  Decreased Interest 0 0 0  Down, Depressed, Hopeless 1 1 0  PHQ - 2 Score 1 1 0  Altered sleeping 0 0 0  Tired, decreased energy 1 1 1   Change in appetite 0 0 0  Feeling bad or failure about yourself  0 2 0  Trouble concentrating 1 1 1   Moving slowly or fidgety/restless 0 0 0  Suicidal thoughts  0 0  PHQ-9 Score 3 5 2   Difficult doing work/chores Not difficult at all Somewhat difficult Not difficult  at all      01/05/2023    2:14 PM 05/22/2022    4:42 PM 04/25/2020    9:25 AM 02/22/2020    9:47 AM  GAD 7 : Generalized Anxiety Score  Nervous, Anxious, on Edge 1 2 2 3   Control/stop worrying 2 2 1 3   Worry too much - different things 2 2 1 3   Trouble relaxing 1 1 1 3   Restless 0 0 1 1  Easily annoyed or irritable 1 1 0 0  Afraid - awful might happen 3 3 2 3   Total GAD 7 Score 10 11 8 16   Anxiety Difficulty Not difficult at all Somewhat difficult Not difficult at all Very difficult     No results found for any visits on 08/05/23.    Assessment & Plan:   GAD (generalized anxiety disorder) -     Sertraline  HCl; Take 1.5 mLs (30 mg total) by mouth daily.  Dispense: 180 mL; Refill: 1  Depression, major, single episode, mild (HCC) -     Sertraline  HCl; Take 1.5 mLs (30 mg total) by mouth daily.  Dispense: 180 mL; Refill: 1  Left-sided face pain  Need for Tdap vaccination  Class 1 obesity with serious comorbidity and body mass index (BMI) of 34.0 to 34.9 in adult, unspecified obesity type  Anxiety/depression symptoms stable.  Continue weaning Zoloft  using liquid 20 mg/mL.  Currently taking about 1.75 mL daily.  New prescription written for 1.5 mL daily.  If needed can go back to 1.7 mL dosing.  Left-sided face pain now resolved.  Likely 2/2 clenching teeth.  Also consider seasonal allergies.  Discussed supportive care including heat, ice, massage, Tylenol or NSAIDs as needed.  Range of motion exercises provided.  For continued symptoms and increase in allergy symptoms start p.o. antihistamine.  Tdap given this visit.  Patient to have updated COVID-vaccine at local pharmacy.  Advised on flu vaccine when next season's vaccines are available mid/late August.  Current BMI 34.67 kg/m.  Weight gain possibly attributed to weaning Zoloft  dose.  Patient encouraged to increase physical activity.  Decrease intake of carbs and sweets.  No follow-ups on file.   Clotilda JONELLE Single, MD

## 2023-08-05 NOTE — Addendum Note (Signed)
 Addended by: BRIEN SONG A on: 08/05/2023 02:47 PM   Modules accepted: Orders

## 2023-08-05 NOTE — Patient Instructions (Addendum)
 Continue working on weaning Zoloft .  Prescription refilled for a dose of 1.5 mL (30 mg) daily.  If this does not work well for you we can always increase the dose to where you are now.  Your next physical is due on or after 01/05/2024.

## 2023-09-03 ENCOUNTER — Other Ambulatory Visit: Payer: Self-pay | Admitting: Family Medicine

## 2023-09-03 DIAGNOSIS — F411 Generalized anxiety disorder: Secondary | ICD-10-CM

## 2023-09-03 DIAGNOSIS — F320 Major depressive disorder, single episode, mild: Secondary | ICD-10-CM

## 2023-11-05 ENCOUNTER — Other Ambulatory Visit: Payer: Self-pay | Admitting: Family Medicine

## 2023-11-05 DIAGNOSIS — F320 Major depressive disorder, single episode, mild: Secondary | ICD-10-CM

## 2023-11-05 DIAGNOSIS — F411 Generalized anxiety disorder: Secondary | ICD-10-CM

## 2023-12-23 ENCOUNTER — Ambulatory Visit: Admitting: Family Medicine

## 2023-12-23 VITALS — BP 122/86 | HR 82 | Temp 98.6°F | Wt 230.0 lb

## 2023-12-23 DIAGNOSIS — F324 Major depressive disorder, single episode, in partial remission: Secondary | ICD-10-CM

## 2023-12-23 DIAGNOSIS — F321 Major depressive disorder, single episode, moderate: Secondary | ICD-10-CM

## 2023-12-23 DIAGNOSIS — Z23 Encounter for immunization: Secondary | ICD-10-CM | POA: Diagnosis not present

## 2023-12-23 DIAGNOSIS — F411 Generalized anxiety disorder: Secondary | ICD-10-CM | POA: Diagnosis not present

## 2023-12-23 DIAGNOSIS — E66811 Obesity, class 1: Secondary | ICD-10-CM

## 2023-12-23 NOTE — Progress Notes (Signed)
 Established Patient Office Visit   Subjective  Patient ID: Jimmy Russo, male    DOB: [birth year]  Age: 33 y.o. MRN: [id number]  Chief Complaint  Patient presents with   Medical Management of Chronic Issues    Patient came in today for a 5 month follow-up, anxiety and depression, Cholesterol    Patient is a 33 year old male seen for follow-up.  Patient states he is doing well overall.  Taking liquid Zoloft  20 mg/mL to wean off medication.  Currently taking 1.25 mL daily for the last month.  Denies issues with sleep, energy, or appetite.  Staying busy with family and work.  Patient states January and February are typically harder months for him due to weather changes.  Requesting flu shot.  Plans to schedule CPE.    Patient Active Problem List   Diagnosis Date Noted   Aortic root dilatation 07/14/2019   Aortic dilatation 05/12/2018   Bicuspid aortic valve 05/12/2018   Aortic valve stenosis 05/12/2018   Educated about COVID-19 virus infection 05/12/2018   Herpes labialis 07/28/2017   Past Medical History:  Diagnosis Date   Bicuspid aortic valve    GERD (gastroesophageal reflux disease)    Past Surgical History:  Procedure Laterality Date   HYDROCELE EXCISION     Social History   Tobacco Use   Smoking status: Former   Smokeless tobacco: Former    Types: Associate Professor status: Former  Substance Use Topics   Alcohol use: Yes    Comment: ocass   Drug use: No   Family History  Problem Relation Age of Onset   Hyperlipidemia Father    Colon cancer Neg Hx    Esophageal cancer Neg Hx    No Known Allergies  ROS Negative unless stated above    Objective:     BP 122/86 (BP Location: Left Arm, Patient Position: Sitting, Cuff Size: Large)   Pulse 82   Temp 98.6 F (37 C) (Oral)   Wt 230 lb (104.3 kg)   SpO2 98%   BMI 33.97 kg/m  BP Readings from Last 3 Encounters:  12/23/23 122/86  08/05/23 118/84  04/10/23 120/86   Wt Readings from  Last 3 Encounters:  12/23/23 230 lb (104.3 kg)  08/05/23 234 lb 12.8 oz (106.5 kg)  04/10/23 231 lb (104.8 kg)      Physical Exam Constitutional:      General: He is not in acute distress.    Appearance: Normal appearance.  HENT:     Head: Normocephalic and atraumatic.     Nose: Nose normal.     Mouth/Throat:     Mouth: Mucous membranes are moist.  Eyes:     Conjunctiva/sclera: Conjunctivae normal.  Cardiovascular:     Rate and Rhythm: Normal rate and regular rhythm.  Pulmonary:     Effort: Pulmonary effort is normal.  Skin:    General: Skin is warm and dry.  Neurological:     Mental Status: He is alert and oriented to person, place, and time. Mental status is at baseline.        12/23/2023    1:36 PM 08/05/2023    2:48 PM 01/05/2023    2:13 PM  Depression screen PHQ 2/9  Decreased Interest 0 0 0  Down, Depressed, Hopeless 1 1 1   PHQ - 2 Score 1 1 1   Altered sleeping 1 1 0  Tired, decreased energy 1 0 1  Change in appetite 0 0 0  Feeling  bad or failure about yourself  1 1 0  Trouble concentrating 0 0 1  Moving slowly or fidgety/restless 0 0 0  Suicidal thoughts 0 0   PHQ-9 Score 4 3  3    Difficult doing work/chores Not difficult at all Not difficult at all Not difficult at all     Data saved with a previous flowsheet row definition      12/23/2023    1:36 PM 08/05/2023    2:48 PM 01/05/2023    2:14 PM 05/22/2022    4:42 PM  GAD 7 : Generalized Anxiety Score  Nervous, Anxious, on Edge 3 2 1 2   Control/stop worrying 3 2 2 2   Worry too much - different things 3 2 2 2   Trouble relaxing 1  1 1   Restless 0 0 0 0  Easily annoyed or irritable 1 1 1 1   Afraid - awful might happen 3 2 3 3   Total GAD 7 Score 14  10 11   Anxiety Difficulty Not difficult at all Not difficult at all Not difficult at all Somewhat difficult     No results found for any visits on 12/23/23.    Assessment & Plan:   GAD (generalized anxiety disorder)  Depression, major, single  episode, in partial remission  Need for influenza vaccination -     Flu vaccine trivalent PF, 6mos and older(Flulaval,Afluria,Fluarix,Fluzone)   GAD-7 score 14, PHQ-9 score for this visit.  GAD and depression symptoms stable per patient.  Will continue wean of Zoloft  20 mg/mL.  Continue taking 1.25 mL (25 mg) daily through January.  Will then continue wean to 1 mL (20 mg) if feeling stable.  Has good family support.  Influenza vaccine given.  Return in about 2-3 months (around 02/23/2024), sooner if needed.  Schedule CPE at patient's convenience.  Clotilda JONELLE Single, MD

## 2024-02-26 ENCOUNTER — Ambulatory Visit (HOSPITAL_COMMUNITY): Admission: RE | Admit: 2024-02-26

## 2024-02-26 DIAGNOSIS — Q2381 Bicuspid aortic valve: Secondary | ICD-10-CM

## 2024-02-26 LAB — ECHOCARDIOGRAM COMPLETE
AR max vel: 1.2 cm2
AV Area VTI: 1.18 cm2
AV Area mean vel: 1.22 cm2
AV Mean grad: 16 mmHg
AV Peak grad: 30.7 mmHg
Ao pk vel: 2.77 m/s
Area-P 1/2: 3.99 cm2
S' Lateral: 2.49 cm
